# Patient Record
Sex: Male | Born: 1990 | Race: Asian | Hispanic: No | Marital: Single | Smoking: Never smoker
Health system: Southern US, Community
[De-identification: ages and names within clinical notes are randomized; demographics above are authoritative.]

## PROBLEM LIST (undated history)

## (undated) DIAGNOSIS — E785 Hyperlipidemia, unspecified: Secondary | ICD-10-CM

## (undated) DIAGNOSIS — R7302 Impaired glucose tolerance (oral): Secondary | ICD-10-CM

## (undated) DIAGNOSIS — E119 Type 2 diabetes mellitus without complications: Secondary | ICD-10-CM

## (undated) DIAGNOSIS — F32A Depression, unspecified: Secondary | ICD-10-CM

## (undated) DIAGNOSIS — F329 Major depressive disorder, single episode, unspecified: Secondary | ICD-10-CM

## (undated) DIAGNOSIS — D563 Thalassemia minor: Secondary | ICD-10-CM

## (undated) DIAGNOSIS — L988 Other specified disorders of the skin and subcutaneous tissue: Secondary | ICD-10-CM

## (undated) DIAGNOSIS — I10 Essential (primary) hypertension: Secondary | ICD-10-CM

## (undated) HISTORY — DX: Essential (primary) hypertension: I10

## (undated) HISTORY — DX: Type 2 diabetes mellitus without complications: E11.9

## (undated) HISTORY — DX: Impaired glucose tolerance (oral): R73.02

## (undated) HISTORY — DX: Depression, unspecified: F32.A

## (undated) HISTORY — DX: Major depressive disorder, single episode, unspecified: F32.9

## (undated) HISTORY — DX: Thalassemia minor: D56.3

## (undated) HISTORY — DX: Other specified disorders of the skin and subcutaneous tissue: L98.8

## (undated) HISTORY — DX: Hyperlipidemia, unspecified: E78.5

---
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
## 2010-11-24 ENCOUNTER — Ambulatory Visit: Payer: Self-pay | Admitting: Internal Medicine

## 2010-11-30 LAB — CONVERTED CEMR LAB
ALT: 70 units/L — ABNORMAL HIGH (ref 0–53)
AST: 33 units/L (ref 0–37)
Albumin: 4.2 g/dL (ref 3.5–5.2)
Alkaline Phosphatase: 67 units/L (ref 39–117)
BUN: 12 mg/dL (ref 6–23)
Basophils Absolute: 0.1 10*3/uL (ref 0.0–0.1)
Basophils Relative: 0.6 % (ref 0.0–3.0)
Bilirubin Urine: NEGATIVE
Bilirubin, Direct: 0.1 mg/dL (ref 0.0–0.3)
CO2: 28 meq/L (ref 19–32)
Calcium: 9.5 mg/dL (ref 8.4–10.5)
Chloride: 101 meq/L (ref 96–112)
Cholesterol: 249 mg/dL — ABNORMAL HIGH (ref 0–200)
Creatinine, Ser: 1 mg/dL (ref 0.4–1.5)
Direct LDL: 150.6 mg/dL
Eosinophils Absolute: 0.1 10*3/uL (ref 0.0–0.7)
Eosinophils Relative: 1.3 % (ref 0.0–5.0)
GFR calc non Af Amer: 108.06 mL/min (ref >60.00)
Glucose, Bld: 113 mg/dL — ABNORMAL HIGH (ref 70–99)
HCT: 48.7 % (ref 39.0–52.0)
HDL: 33.2 mg/dL — ABNORMAL LOW (ref >39.00)
Hemoglobin, Urine: NEGATIVE
Hemoglobin: 15.4 g/dL (ref 13.0–17.0)
Hgb A1c MFr Bld: 5.6 % (ref 4.6–6.5)
Ketones, ur: NEGATIVE mg/dL
Leukocytes, UA: NEGATIVE
Lymphocytes Relative: 33.2 % (ref 12.0–46.0)
Lymphs Abs: 2.9 10*3/uL (ref 0.7–4.0)
MCHC: 31.6 g/dL (ref 30.0–36.0)
MCV: 76.2 fL — ABNORMAL LOW (ref 78.0–100.0)
Monocytes Absolute: 0.5 10*3/uL (ref 0.1–1.0)
Monocytes Relative: 6.2 % (ref 3.0–12.0)
Neutro Abs: 5.1 10*3/uL (ref 1.4–7.7)
Neutrophils Relative %: 58.7 % (ref 43.0–77.0)
Nitrite: NEGATIVE
Platelets: 264 10*3/uL (ref 150.0–400.0)
Potassium: 4.3 meq/L (ref 3.5–5.1)
RBC: 6.4 M/uL — ABNORMAL HIGH (ref 4.22–5.81)
RDW: 13.6 % (ref 11.5–14.6)
Sodium: 140 meq/L (ref 135–145)
Specific Gravity, Urine: 1.015 (ref 1.000–1.030)
TSH: 1.96 microintl units/mL (ref 0.35–5.50)
Total Bilirubin: 0.7 mg/dL (ref 0.3–1.2)
Total CHOL/HDL Ratio: 8
Total Protein, Urine: NEGATIVE mg/dL
Total Protein: 7.5 g/dL (ref 6.0–8.3)
Triglycerides: 343 mg/dL — ABNORMAL HIGH (ref 0.0–149.0)
Urine Glucose: NEGATIVE mg/dL
Urobilinogen, UA: 0.2 (ref 0.0–1.0)
VLDL: 68.6 mg/dL — ABNORMAL HIGH (ref 0.0–40.0)
WBC: 8.7 10*3/uL (ref 4.5–10.5)
pH: 7 (ref 5.0–8.0)

## 2010-12-01 ENCOUNTER — Ambulatory Visit: Payer: Self-pay | Admitting: Internal Medicine

## 2010-12-01 DIAGNOSIS — E785 Hyperlipidemia, unspecified: Secondary | ICD-10-CM

## 2011-01-26 NOTE — Assessment & Plan Note (Signed)
Summary: physical---stc   Vital Signs:  Patient profile:   20 year old male Height:      68 inches Weight:      221.13 pounds BMI:     33.74 O2 Sat:      98 % on Room air Temp:     98.6 degrees F oral Pulse rate:   93 / minute BP sitting:   112 / 70  (left arm) Cuff size:   large  Vitals Entered By: Zella Ball Ewing CMA Duncan Dull) (December 01, 2010 1:06 PM)  O2 Flow:  Room air  CC: Adult Physical/RE   CC:  Adult Physical/RE.  History of Present Illness: here with mother for wellness;  overall doing ok;  still going to [employer] and getting fair grades;  Pt denies CP, worsening sob, doe, wheezing, orthopnea, pnd, worsening LE edema, palps, dizziness or syncope  Pt denies new neuro symptoms such as headache, facial or extremity weakness  Pt denies polydipsia, polyuria  Overall good compliance with meds, trying to follow low chol diet, wt stable, little excercise however .  No fever, wt loss, night sweats, loss of appetite or other constitutional symptoms  Denies worsening depressive symptoms, suicidal ideation, or panic.   Overall good compliance with meds, and good tolerability.  Pt states good ability with ADL's, low fall risk, home safety reviewed and adequate, no significant change in hearing or vision, trying to follow lower chol diet, and occasionally active only with regular excercise.   Preventive Screening-Counseling & Management      Drug Use:  no.    Problems Prior to Update: 1)  Depression  (ICD-311) 2)  Hyperlipidemia  (ICD-272.4) 3)  Depressive Disorder  (ICD-311) 4)  Preventive Health Care  (ICD-V70.0) 5)  Hypertension  (ICD-401.9)  Medications Prior to Update: 1)  Cozaar 100 Mg Tabs (Losartan Potassium) .Marland Kitchen.. 1 By Mouth Once Daily 2)  Hydrochlorothiazide 25 Mg Tabs (Hydrochlorothiazide) .... 1/2 By Mouth Once Daily  Current Medications (verified): 1)  Losartan Potassium-Hctz 100-12.5 Mg Tabs (Losartan Potassium-Hctz) .Marland Kitchen.. 1po Once Daily  Allergies (verified): 1)   Enalapril Maleate  Past History:  Past Surgical History: Last updated: 06/15/2008 removal of cyst under tongue/ 1997 exploration of superpubic area/2001  Family History: Last updated: 01/13/2008 father with dm, cholesterol grandparents with HTN, stroke uncle with hemorrahagic stroke at 47yo  Social History: Last updated: 12/01/2010 Never Smoked Alcohol use-no philipino lives with parents [employer] - studying for surgical tech Drug use-no  Risk Factors: Smoking Status: never (01/13/2008)  Past Medical History: Hypertension pelvic fistula - chronic Hyperlipidemia Depression  Social History: Reviewed history from 11/30/2009 and no changes required. Never Smoked Alcohol use-no philipino lives with parents [employer] - studying for surgical tech Drug use-no Drug Use:  no  Review of Systems  The patient denies anorexia, fever, vision loss, decreased hearing, hoarseness, chest pain, syncope, dyspnea on exertion, peripheral edema, prolonged cough, headaches, hemoptysis, abdominal pain, melena, hematochezia, severe indigestion/heartburn, hematuria, muscle weakness, suspicious skin lesions, transient blindness, difficulty walking, depression, unusual weight change, abnormal bleeding, enlarged lymph nodes, angioedema, and testicular masses.         all otherwise negative per pt -    Physical Exam  General:  alert and overweight-appearing.   Head:  normocephalic and atraumatic.   Eyes:  vision grossly intact, pupils equal, and pupils round.   Ears:  R ear normal and L ear normal.   Nose:  no external deformity and no nasal discharge.   Mouth:  no gingival  abnormalities and pharynx pink and moist.   Neck:  supple and no masses.   Lungs:  normal respiratory effort and normal breath sounds.   Heart:  normal rate and regular rhythm.   Abdomen:  soft, non-tender, and normal bowel sounds.   Msk:  no joint tenderness and no joint swelling.   Extremities:  no edema, no erythema    Neurologic:  cranial nerves II-XII intact and strength normal in all extremities.   Skin:  color normal and no rashes.   Psych:  not anxious appearing and flat affect.     Impression & Recommendations:  Problem # 1:  Preventive Health Care (ICD-V70.0) Overall doing well, age appropriate education and counseling updated, referral for preventive services and immunizations addressed, dietary counseling and smoking status adressed , most recent labs reviewed I have personally reviewed and have noted 1.The patient's medical and social history 2.Their use of alcohol, tobacco or illicit drugs 3.Their current medications and supplements 4. Functional ability including ADL's, fall risk, home safety risk, hearing & visual impairment  5.Diet and physical activities 6.Evidence for depression or mood disorders The patients weight, height, BMI  have been recorded in the chart I have made referrals, counseling and provided education to the patient based review of the above   Problem # 2:  HYPERTENSION (ICD-401.9)  The following medications were removed from the medication list:    Hydrochlorothiazide 25 Mg Tabs (Hydrochlorothiazide) .Marland Kitchen... 1/2 by mouth once daily His updated medication list for this problem includes:    Losartan Potassium-hctz 100-12.5 Mg Tabs (Losartan potassium-hctz) .Marland Kitchen... 1po once daily  BP today: 112/70 Prior BP: 110/76 (11/30/2009)  Labs Reviewed: K+: 4.3 (11/30/2010) Creat: : 1.0 (11/30/2010)   Chol: 249 (11/30/2010)   HDL: 33.20 (11/30/2010)   LDL: DEL (01/13/2008)   TG: 343.0 (11/30/2010) stable overall by hx and exam, ok to continue meds/tx as is  - meds adjusted to single rx combo med, same strengths  Problem # 3:  DEPRESSION (ICD-311) stable overall by hx and exam, ok to continue meds/tx as is - decleins need for med at this time or counseling  Problem # 4:  HYPERLIPIDEMIA (ICD-272.4)  Labs Reviewed: SGOT: 33 (11/30/2010)   SGPT: 70 (11/30/2010)   HDL:33.20  (11/30/2010), 32.30 (11/26/2009)  LDL:DEL (01/13/2008)  Chol:249 (11/30/2010), 245 (11/26/2009)  Trig:343.0 (11/30/2010), 318.0 (11/26/2009)  consider lipitor 10 mg  - pt or mother to call   Complete Medication List: 1)  Losartan Potassium-hctz 100-12.5 Mg Tabs (Losartan potassium-hctz) .Marland Kitchen.. 1po once daily  Other Orders: Admin 1st Vaccine ([address]) Flu Vaccine 36yrs + [phone])  Patient Instructions: 1)  you had the flu shot today 2)  call if you would like the lipitor 10 mg 3)  Continue all previous medications as before this visit 4)  Please schedule a follow-up appointment in 1 year, or sooner if needed Prescriptions: LOSARTAN POTASSIUM-HCTZ 100-12.5 MG TABS (LOSARTAN POTASSIUM-HCTZ) 1po once daily  #90 x 3   Entered and Authorized by:   Corwin Levins MD   Signed by:   Corwin Levins MD on 12/01/2010   Method used:   Print then Give to Patient   RxID:   [id number]    Orders Added: 1)  Admin 1st Vaccine [90471] 2)  Flu Vaccine 51yrs + [30865] 3)  Est. Patient 18-39 years [78469]  Flu Vaccine Consent Questions     Do you have a history of severe allergic reactions to this vaccine? no    Any prior history of allergic  reactions to egg and/or gelatin? no    Do you have a sensitivity to the preservative Thimersol? no    Do you have a past history of Guillan-Barre Syndrome? no    Do you currently have an acute febrile illness? no    Have you ever had a severe reaction to latex? no    Vaccine information given and explained to patient? yes    Are you currently pregnant? no    Lot Number:[id number]   Exp Date:06/24/2011   Site Given  Right Deltoid IM        .[email or web address]

## 2011-02-08 ENCOUNTER — Telehealth: Payer: Self-pay | Admitting: Internal Medicine

## 2011-02-15 NOTE — Progress Notes (Signed)
  Phone Note Refill Request Message from:  Fax from Pharmacy on February 08, 2011 4:25 PM  Refills Requested: Medication #1:  LOSARTAN POTASSIUM-HCTZ 100-12.5 MG TABS 1po once daily.   Dosage confirmed as above?Dosage Confirmed   Notes: [facility] Initial call taken by: Robin Ewing CMA Duncan Dull),  February 08, 2011 4:26 PM    Prescriptions: LOSARTAN POTASSIUM-HCTZ 100-12.5 MG TABS (LOSARTAN POTASSIUM-HCTZ) 1po once daily  #90 x 3   Entered by:   Scharlene Gloss CMA (AAMA)   Authorized by:   Corwin Levins MD   Signed by:   Scharlene Gloss CMA (AAMA) on 02/08/2011   Method used:   Faxed to ...       [facility]* (retail)       [address]       [address] Shipping/mailing       [place], [place]  [address]       Ph: [phone]       Fax: [phone]   RxID:   [id number]

## 2011-05-12 NOTE — Op Note (Signed)
[facility]  Patient:    Jerry Stokes, Jerry Stokes Visit Number: [id number] MRN: [id number]          Service Type: DSU Location: [facility] Attending Physician:  Carlos Levering Dictated by:   Hyman Bible Pendse, M.D. Proc. Date: 01/10/02 Admit Date:  01/10/2002 Discharge Date: 01/10/2002   CC:         Maeola Harman, M.D.                           Operative Report  PREOPERATIVE DIAGNOSIS: 1. Suprapubic sinus. 2. Redundant prepuce.  POSTOPERATIVE DIAGNOSIS: 1. Suprapubic sinus. 2. Redundant prepuce.  OPERATION PERFORMED: 1. Exploration of suprapubic region and excision of about an 8 cm long deep    suprapubic sinus. 2. Circumcision.  SURGEON:  Prabhakar D. Levie Heritage, M.D.  ASSISTANT:  Nelida Meuse, M.D.  ANESTHESIA:  Nurse.  INDICATIONS FOR PROCEDURE:  This 20 year old boy was noted a tiny opening of the suprapubic area at the base of the penis which drained occasional straw-colored fluid.  There had never been any history of infection. Preoperative sinogram was done which showed about a 1.5 cm deep suprapubic sinus without any communication with the bladder, urethra or any other structures.  OPERATIVE FINDINGS:  Exploration of the suprapubic area revealed well-developed sinus tract starting from the base of the penis and going vertically down deep to thick bulky pad of superpubic fat in the midline up to the pubic tubercle and then slightly to the left of midline to the pubic rami and no definite deeper communication could be identified.  The tract was partially blocked due to inspissated sebaceous material.  DESCRIPTION OF PROCEDURE:  Under satisfactory general anesthesia, patient in the supine position, the superpubic area and the penis were thoroughly prepped and draped in the usual manner.  An elliptical incision was made around the sinus opening.  A small probe was passed through the sinus which went down up to about 3 cm  .  Blunt and sharp dissection was carried out in a deeper plane and to my surprise, the sinus seemed to be well formed and going much deeper. Further dissection was carried out into a deeper plane.  The sinus was about 6 to 6 to 7 cm in length and what appeared to be attaching to the left pubic ramus.  There were no signs of inflammatory reaction.  The sinus was dissected out deep as was possible and two Hemoclips were applied and the sinus was cut. The area was irrigated.  Deeper layers approximated with 3-0 Vicryl interrupted sutures.  SKin closed with 5-0 nylon interrupted as well as running interlocking sutures.  Having completed this part of the procedure, circumcision procedure was initiated.  Circumferential incision was made over the distal aspect of the penis.  Skin was undermined distally.  Prepuce was everted after a dorsal slit incision.  ____________ incision about 3 to 4 mm ____________ coronal sulcus.  Redundant skin and mucosa excised.  Skin and mucosa were now approximated with 5-0 chromic sutures.  0.25% Marcaine with epinephrine was injected locally for postoperative analgesia.  Appropriate dressing applied.  Occlusive dressing over the superpubic area.  Xeroform gauze for the penis.  Throughout the procedure, the patients vital signs remained stable.  The patient withstood the procedure well and was transferred to the recovery room in satisfactory general condition. Dictated by:   Hyman Bible Pendse, M.D. Attending Physician:  Carlos Levering DD:  01/10/02 TD:  01/13/02 Job: [id number]

## 2011-09-25 LAB — POCT RAPID STREP A: Streptococcus, Group A Screen (Direct): NEGATIVE

## 2011-11-01 ENCOUNTER — Telehealth: Payer: Self-pay

## 2011-11-01 DIAGNOSIS — Z0000 Encounter for general adult medical examination without abnormal findings: Secondary | ICD-10-CM

## 2011-11-01 NOTE — Telephone Encounter (Signed)
Put order in for physical labs. 

## 2011-12-09 ENCOUNTER — Encounter: Payer: Self-pay | Admitting: Internal Medicine

## 2011-12-09 DIAGNOSIS — Z0001 Encounter for general adult medical examination with abnormal findings: Secondary | ICD-10-CM | POA: Insufficient documentation

## 2011-12-09 DIAGNOSIS — Z0000 Encounter for general adult medical examination without abnormal findings: Secondary | ICD-10-CM | POA: Insufficient documentation

## 2011-12-11 ENCOUNTER — Other Ambulatory Visit (INDEPENDENT_AMBULATORY_CARE_PROVIDER_SITE_OTHER): Payer: 59

## 2011-12-11 ENCOUNTER — Other Ambulatory Visit: Payer: Self-pay | Admitting: Internal Medicine

## 2011-12-11 DIAGNOSIS — Z0000 Encounter for general adult medical examination without abnormal findings: Secondary | ICD-10-CM

## 2011-12-11 LAB — BASIC METABOLIC PANEL WITH GFR
BUN: 13 mg/dL (ref 6–23)
CO2: 26 meq/L (ref 19–32)
Calcium: 9.4 mg/dL (ref 8.4–10.5)
Chloride: 105 meq/L (ref 96–112)
Creatinine, Ser: 0.9 mg/dL (ref 0.4–1.5)
GFR: 113.82 mL/min
Glucose, Bld: 113 mg/dL — ABNORMAL HIGH (ref 70–99)
Potassium: 4.2 meq/L (ref 3.5–5.1)
Sodium: 141 meq/L (ref 135–145)

## 2011-12-11 LAB — URINALYSIS, ROUTINE W REFLEX MICROSCOPIC
Bilirubin Urine: NEGATIVE
Hgb urine dipstick: NEGATIVE
Ketones, ur: NEGATIVE
Leukocytes, UA: NEGATIVE
Nitrite: NEGATIVE
Specific Gravity, Urine: >=1.03
Total Protein, Urine: NEGATIVE
Urine Glucose: NEGATIVE
Urobilinogen, UA: 0.2
pH: 6 (ref 5.0–8.0)

## 2011-12-11 LAB — HEPATIC FUNCTION PANEL
ALT: 67 U/L — ABNORMAL HIGH (ref 0–53)
AST: 29 U/L (ref 0–37)
Albumin: 4.5 g/dL (ref 3.5–5.2)
Alkaline Phosphatase: 72 U/L (ref 39–117)
Bilirubin, Direct: 0.1 mg/dL (ref 0.0–0.3)
Total Bilirubin: 0.9 mg/dL (ref 0.3–1.2)
Total Protein: 7.7 g/dL (ref 6.0–8.3)

## 2011-12-11 LAB — LIPID PANEL
Cholesterol: 231 mg/dL — ABNORMAL HIGH (ref 0–200)
HDL: 37.4 mg/dL — ABNORMAL LOW
Total CHOL/HDL Ratio: 6
Triglycerides: 219 mg/dL — ABNORMAL HIGH (ref 0.0–149.0)
VLDL: 43.8 mg/dL — ABNORMAL HIGH (ref 0.0–40.0)

## 2011-12-11 LAB — CBC WITH DIFFERENTIAL/PLATELET
Basophils Absolute: 0.1 K/uL (ref 0.0–0.1)
Basophils Relative: 0.8 % (ref 0.0–3.0)
Eosinophils Absolute: 0.1 K/uL (ref 0.0–0.7)
Eosinophils Relative: 1.3 % (ref 0.0–5.0)
HCT: 46.4 % (ref 39.0–52.0)
Hemoglobin: 14.9 g/dL (ref 13.0–17.0)
Lymphocytes Relative: 30.7 % (ref 12.0–46.0)
Lymphs Abs: 2.6 K/uL (ref 0.7–4.0)
MCHC: 32.1 g/dL (ref 30.0–36.0)
MCV: 75.4 fl — ABNORMAL LOW (ref 78.0–100.0)
Monocytes Absolute: 0.6 K/uL (ref 0.1–1.0)
Monocytes Relative: 7.3 % (ref 3.0–12.0)
Neutro Abs: 5.2 K/uL (ref 1.4–7.7)
Neutrophils Relative %: 59.9 % (ref 43.0–77.0)
Platelets: 250 K/uL (ref 150.0–400.0)
RBC: 6.15 Mil/uL — ABNORMAL HIGH (ref 4.22–5.81)
RDW: 13.5 % (ref 11.5–14.6)
WBC: 8.6 K/uL (ref 4.5–10.5)

## 2011-12-11 LAB — TSH: TSH: 1.47 u[IU]/mL (ref 0.35–5.50)

## 2011-12-12 ENCOUNTER — Ambulatory Visit (INDEPENDENT_AMBULATORY_CARE_PROVIDER_SITE_OTHER)
Admission: RE | Admit: 2011-12-12 | Discharge: 2011-12-12 | Disposition: A | Discharge status: Home / Self Care | Payer: 59 | Source: Ambulatory Visit | Attending: Internal Medicine | Admitting: Internal Medicine

## 2011-12-12 ENCOUNTER — Ambulatory Visit (INDEPENDENT_AMBULATORY_CARE_PROVIDER_SITE_OTHER): Payer: 59 | Admitting: Internal Medicine

## 2011-12-12 ENCOUNTER — Other Ambulatory Visit: Payer: 59

## 2011-12-12 ENCOUNTER — Encounter: Payer: Self-pay | Admitting: Internal Medicine

## 2011-12-12 VITALS — BP 120/70 | HR 79 | Temp 98.4°F | Ht 68.0 in | Wt 222.5 lb

## 2011-12-12 DIAGNOSIS — R7302 Impaired glucose tolerance (oral): Secondary | ICD-10-CM

## 2011-12-12 DIAGNOSIS — I10 Essential (primary) hypertension: Secondary | ICD-10-CM

## 2011-12-12 DIAGNOSIS — R7611 Nonspecific reaction to tuberculin skin test without active tuberculosis: Secondary | ICD-10-CM

## 2011-12-12 DIAGNOSIS — R7309 Other abnormal glucose: Secondary | ICD-10-CM

## 2011-12-12 DIAGNOSIS — E119 Type 2 diabetes mellitus without complications: Secondary | ICD-10-CM | POA: Insufficient documentation

## 2011-12-12 DIAGNOSIS — Z23 Encounter for immunization: Secondary | ICD-10-CM

## 2011-12-12 DIAGNOSIS — Z0000 Encounter for general adult medical examination without abnormal findings: Secondary | ICD-10-CM

## 2011-12-12 HISTORY — DX: Impaired glucose tolerance (oral): R73.02

## 2011-12-12 LAB — IBC PANEL: Saturation Ratios: 30.7 % (ref 20.0–50.0)

## 2011-12-12 NOTE — Patient Instructions (Signed)
You had the flu shot today Please go to LAB in the Basement for the blood and/or urine tests to be done today Please go to XRAY in the Basement for the x-ray test Please call the phone number [phone] (the [employer]Tree System) for results of testing in 2-3 days;  When calling, simply dial the number, and when prompted enter the MRN number above (the Medical Record Number) and the # key, then the message should start. Your form will be filled out soon Please return in 6 months for LAB only  - for sugar and cholesterol Please return in 1 year for your yearly visit, or sooner if needed, with Lab testing done 3-5 days before

## 2011-12-17 ENCOUNTER — Encounter: Payer: Self-pay | Admitting: Internal Medicine

## 2011-12-17 NOTE — Assessment & Plan Note (Signed)
stable overall by hx and exam, most recent data reviewed with pt, and pt to continue medical treatment as before  BP Readings from Last 3 Encounters:  12/12/11 120/70  12/01/10 112/70  11/30/09 110/76

## 2011-12-17 NOTE — Assessment & Plan Note (Signed)
Overall doing well, age appropriate education and counseling updated, referrals for preventative services and immunizations addressed, dietary and smoking counseling addressed, most recent labs and ECG reviewed.  I have personally reviewed and have noted: 1) the patient's medical and social history 2) The pt's use of alcohol, tobacco, and illicit drugs 3) The patient's current medications and supplements 4) Functional ability including ADL's, fall risk, home safety risk, hearing and visual impairment 5) Diet and physical activities 6) Evidence for depression or mood disorder 7) The patient's height, weight, and BMI have been recorded in the chart I have made referrals, and provided counseling and education based on review of the above Form to be filled out for [facility]

## 2011-12-17 NOTE — Assessment & Plan Note (Signed)
For CXR today

## 2011-12-17 NOTE — Progress Notes (Signed)
Subjective:    Patient ID: Jerry Stokes, male    DOB: Jun 28, 1991, 20 y.o.   MRN: [id number]  HPI  Here for wellness and f/u;  Overall doing ok;  Pt denies CP, worsening SOB, DOE, wheezing, orthopnea, PND, worsening LE edema, palpitations, dizziness or syncope.  Pt denies neurological change such as new Headache, facial or extremity weakness.  Pt denies polydipsia, polyuria, or low sugar symptoms. Pt states overall good compliance with treatment and medications, good tolerability, and trying to follow lower cholesterol diet.  Pt denies worsening depressive symptoms, suicidal ideation or panic. No fever, wt loss, night sweats, loss of appetite, or other constitutional symptoms.  Pt states good ability with ADL's, low fall risk, home safety reviewed and adequate, no significant changes in hearing or vision, and occasionally active with exercise.  Needs form filled out for [facility] as he is transferring there from [facility].  For flu shot today.  Needs varicella titer, as well as CXR due to hx of + PPD (s/p INH at 20yo) and also received BCG vaccination.   Past Medical History  Diagnosis Date  . Hypertension   . Depression   . Hyperlipidemia   . Fistula     Pelvic  . Impaired glucose tolerance 12/12/2011   Past Surgical History  Procedure Date  . Cystectomy 1997    Under tongue  . Exploratory laparotomy 2001    Suprapubic fistula repair age 83    reports that he has never smoked. He does not have any smokeless tobacco history on file. He reports that he does not drink alcohol or use illicit drugs. family history includes Diabetes in his father; Hyperlipidemia in his father; Hypertension in his other; and Stroke in his other. Also - + FH thallasemia trait. Allergies  Allergen Reactions  . Enalapril Maleate     REACTION: cough   Current Outpatient Prescriptions on File Prior to Visit  Medication Sig Dispense Refill  . losartan-hydrochlorothiazide (HYZAAR) 100-12.5 MG per tablet Take 1 tablet by mouth  daily.         Review of Systems Review of Systems  Constitutional: Negative for diaphoresis, activity change, appetite change and unexpected weight change.  HENT: Negative for hearing loss, ear pain, facial swelling, mouth sores and neck stiffness.   Eyes: Negative for pain, redness and visual disturbance.  Respiratory: Negative for shortness of breath and wheezing.   Cardiovascular: Negative for chest pain and palpitations.  Gastrointestinal: Negative for diarrhea, blood in stool, abdominal distention and rectal pain.  Genitourinary: Negative for hematuria, flank pain and decreased urine volume.  Musculoskeletal: Negative for myalgias and joint swelling.  Skin: Negative for color change and wound.  Neurological: Negative for syncope and numbness.  Hematological: Negative for adenopathy.  Psychiatric/Behavioral: Negative for hallucinations, self-injury, decreased concentration and agitation.      Objective:   Physical Exam BP 120/70  Pulse 79  Temp(Src) 98.4 F (36.9 C) (Oral)  Ht 5\' 8"  (1.727 m)  Wt 222 lb 8 oz (100.925 kg)  BMI 33.83 kg/m2  SpO2 97% Physical Exam  VS noted Constitutional: Pt is oriented to person, place, and time. Appears well-developed and well-nourished.  HENT:  Head: Normocephalic and atraumatic.  Right Ear: External ear normal.  Left Ear: External ear normal.  Nose: Nose normal.  Mouth/Throat: Oropharynx is clear and moist.  Eyes: Conjunctivae and EOM are normal. Pupils are equal, round, and reactive to light.  Neck: Normal range of motion. Neck supple. No JVD present. No tracheal deviation present.  Cardiovascular: Normal rate, regular rhythm, normal heart sounds and intact distal pulses.   Pulmonary/Chest: Effort normal and breath sounds normal.  Abdominal: Soft. Bowel sounds are normal. There is no tenderness.  Musculoskeletal: Normal range of motion. Exhibits no edema.  Lymphadenopathy:  Has no cervical adenopathy.  Neurological: Pt is alert and  oriented to person, place, and time. Pt has normal reflexes. No cranial nerve deficit.  Skin: Skin is warm and dry. No rash noted.  Psychiatric:  Has chronic "masked" facies with severe limited emotional expression. Behavior is o/w normal. Does perk up with talking about video games    Assessment & Plan:

## 2011-12-17 NOTE — Assessment & Plan Note (Signed)
Stressed increased exercise and wt control, for a1c

## 2012-01-01 ENCOUNTER — Other Ambulatory Visit: Payer: Self-pay | Admitting: Internal Medicine

## 2012-01-01 ENCOUNTER — Telehealth: Payer: Self-pay

## 2012-01-01 ENCOUNTER — Other Ambulatory Visit: Payer: 59

## 2012-01-01 DIAGNOSIS — Z0000 Encounter for general adult medical examination without abnormal findings: Secondary | ICD-10-CM

## 2012-01-01 NOTE — Telephone Encounter (Signed)
Patient came by the office and did pickup the patients form for [facility]. Also per MD gave her a copy of the patients recent lab results. Informed the patient would need to return to the lab to have varicella titer done and did apologize as to not being done at last lab visit. Patient did request the name of our manager as she was concerned that the titer lab was not done. Informed her of managers name.

## 2012-01-01 NOTE — Telephone Encounter (Signed)
To contact [facility] directly (robin or mother) to confirm, if not, I have re-done the order, made sure it was ""future" this time, though I think it was before as well

## 2012-01-01 NOTE — Progress Notes (Signed)
Order re-done; made sure status was for "future"

## 2012-01-01 NOTE — Telephone Encounter (Signed)
Called the patients mother left message to call back

## 2012-01-01 NOTE — Telephone Encounter (Signed)
Called the patients mom informed to pickup form. Also called the lab and they did not draw the titer. Informed the patients mother who thinks it was done, please advise

## 2012-01-01 NOTE — Telephone Encounter (Signed)
Patient's mother called stating they need the for [employer] to be completed today if possible. The patient was seen in December and the form was left to complete. Call back number is [phone] when ready

## 2012-01-01 NOTE — Telephone Encounter (Signed)
The form is complete from my point of view except for the varicalla titer result that I was waiting for; and now I think I see on the EMR that for some reason it was not done?  Robin to call lab; was it sent to [facility] and do we need to call them for result?

## 2012-03-21 ENCOUNTER — Other Ambulatory Visit: Payer: Self-pay

## 2012-03-21 MED ORDER — LOSARTAN POTASSIUM-HCTZ 100-12.5 MG PO TABS: 1.0000 | ORAL_TABLET | Freq: Every day | ORAL | Status: DC

## 2012-11-07 ENCOUNTER — Other Ambulatory Visit: Payer: Self-pay | Admitting: Internal Medicine

## 2012-12-17 ENCOUNTER — Other Ambulatory Visit (INDEPENDENT_AMBULATORY_CARE_PROVIDER_SITE_OTHER): Payer: 59

## 2012-12-17 ENCOUNTER — Encounter: Payer: Self-pay | Admitting: Internal Medicine

## 2012-12-17 ENCOUNTER — Ambulatory Visit (INDEPENDENT_AMBULATORY_CARE_PROVIDER_SITE_OTHER): Payer: 59 | Admitting: Internal Medicine

## 2012-12-17 ENCOUNTER — Encounter: Payer: 59 | Admitting: Internal Medicine

## 2012-12-17 VITALS — BP 132/80 | HR 76 | Temp 97.1°F | Ht 69.0 in | Wt 224.0 lb

## 2012-12-17 DIAGNOSIS — Z0000 Encounter for general adult medical examination without abnormal findings: Secondary | ICD-10-CM

## 2012-12-17 DIAGNOSIS — Z23 Encounter for immunization: Secondary | ICD-10-CM

## 2012-12-17 DIAGNOSIS — R7302 Impaired glucose tolerance (oral): Secondary | ICD-10-CM

## 2012-12-17 DIAGNOSIS — F329 Major depressive disorder, single episode, unspecified: Secondary | ICD-10-CM

## 2012-12-17 DIAGNOSIS — R7309 Other abnormal glucose: Secondary | ICD-10-CM

## 2012-12-17 DIAGNOSIS — I10 Essential (primary) hypertension: Secondary | ICD-10-CM

## 2012-12-17 LAB — BASIC METABOLIC PANEL
Calcium: 9.6 mg/dL (ref 8.4–10.5)
GFR: 104.61 mL/min (ref >60.00)
Glucose, Bld: 107 mg/dL — ABNORMAL HIGH (ref 70–99)
Potassium: 3.8 mEq/L (ref 3.5–5.1)
Sodium: 139 mEq/L (ref 135–145)

## 2012-12-17 LAB — URINALYSIS, ROUTINE W REFLEX MICROSCOPIC
Bilirubin Urine: NEGATIVE
Ketones, ur: NEGATIVE
Total Protein, Urine: NEGATIVE
Urine Glucose: NEGATIVE
Urobilinogen, UA: 0.2 (ref 0.0–1.0)

## 2012-12-17 LAB — HEPATIC FUNCTION PANEL
ALT: 85 U/L — ABNORMAL HIGH (ref 0–53)
AST: 36 U/L (ref 0–37)
Total Protein: 7.8 g/dL (ref 6.0–8.3)

## 2012-12-17 LAB — CBC WITH DIFFERENTIAL/PLATELET
Basophils Relative: 0.4 % (ref 0.0–3.0)
Eosinophils Relative: 1.6 % (ref 0.0–5.0)
HCT: 48.3 % (ref 39.0–52.0)
Hemoglobin: 15.4 g/dL (ref 13.0–17.0)
Lymphs Abs: 3.7 10*3/uL (ref 0.7–4.0)
MCV: 74.2 fl — ABNORMAL LOW (ref 78.0–100.0)
Monocytes Absolute: 0.8 10*3/uL (ref 0.1–1.0)
Monocytes Relative: 8 % (ref 3.0–12.0)
Neutro Abs: 5.6 10*3/uL (ref 1.4–7.7)
RBC: 6.5 Mil/uL — ABNORMAL HIGH (ref 4.22–5.81)
WBC: 10.3 10*3/uL (ref 4.5–10.5)

## 2012-12-17 LAB — LIPID PANEL
Cholesterol: 255 mg/dL — ABNORMAL HIGH (ref 0–200)
HDL: 32.2 mg/dL — ABNORMAL LOW (ref >39.00)
VLDL: 42.4 mg/dL — ABNORMAL HIGH (ref 0.0–40.0)

## 2012-12-17 LAB — TSH: TSH: 3.79 u[IU]/mL (ref 0.35–5.50)

## 2012-12-17 MED ORDER — LOSARTAN POTASSIUM-HCTZ 100-12.5 MG PO TABS: 1.0000 | ORAL_TABLET | Freq: Every day | ORAL | Status: DC

## 2012-12-17 NOTE — Patient Instructions (Addendum)
Please consider the Gardasil vaccination You had the flu shot today Continue all other medications as before; please re-start the blood pressure medication Please continue your efforts at being more active, low cholesterol diet, and weight control. Your blood work was done today You will be contacted by phone if any changes need to be made immediately.  Otherwise, you will receive a letter about your results with an explanation, but please check with MyChart first. Thank you for enrolling in MyChart. Please follow the instructions below to securely access your online medical record. MyChart allows you to send messages to your doctor, view your test results, renew your prescriptions, schedule appointments, and more. To Log into MyChart, please go to [email or web address], and your Username is: [email or web address] Please return in 1 year for your yearly visit, or sooner if needed, with Lab testing done 3-5 days before

## 2012-12-19 ENCOUNTER — Encounter: Payer: Self-pay | Admitting: Internal Medicine

## 2012-12-19 NOTE — Progress Notes (Signed)
Subjective:    Patient ID: Jerry Stokes, male    DOB: December 28, 1990, 21 y.o.   MRN: [id number]  HPI  Here for wellness and f/u;  Overall doing ok;  Pt denies CP, worsening SOB, DOE, wheezing, orthopnea, PND, worsening LE edema, palpitations, dizziness or syncope.  Pt denies neurological change such as new Headache, facial or extremity weakness.  Pt denies polydipsia, polyuria, or low sugar symptoms. Pt states overall poor compliance with treatment and medications, o/w good tolerability, and not reallty trying to follow lower cholesterol diet.  Pt denies worsening depressive symptoms, suicidal ideation or panic. No fever, wt loss, night sweats, loss of appetite, or other constitutional symptoms.  Pt states good ability with ADL's, low fall risk, home safety reviewed and adequate, no significant changes in hearing or vision, and rarely active with exercise. Is taking classes at [employer], has mult semesters left to finish.  Mother conts to support well, and states she (and pt) hoping pt to apply eventually to medical school. Past Medical History  Diagnosis Date  . Hypertension   . Depression   . Hyperlipidemia   . Fistula     Pelvic  . Impaired glucose tolerance 12/12/2011  . Alpha thalassemia trait     several in family, as well as mother   Past Surgical History  Procedure Date  . Cystectomy 1997    Under tongue  . Exploratory laparotomy 2001    Suprapubic fistula repair age 4    reports that he has never smoked. He does not have any smokeless tobacco history on file. He reports that he does not drink alcohol or use illicit drugs. family history includes Diabetes in his father; Hyperlipidemia in his father; Hypertension in his other; and Stroke in his other. Allergies  Allergen Reactions  . Enalapril Maleate     REACTION: cough   Current Outpatient Prescriptions on File Prior to Visit  Medication Sig Dispense Refill  . losartan-hydrochlorothiazide (HYZAAR) 100-12.5 MG per tablet Take 1  tablet by mouth daily.  90 tablet  3    Review of Systems Review of Systems  Constitutional: Negative for diaphoresis, activity change, appetite change and unexpected weight change.  HENT: Negative for hearing loss, ear pain, facial swelling, mouth sores and neck stiffness.   Eyes: Negative for pain, redness and visual disturbance.  Respiratory: Negative for shortness of breath and wheezing.   Cardiovascular: Negative for chest pain and palpitations.  Gastrointestinal: Negative for diarrhea, blood in stool, abdominal distention and rectal pain.  Genitourinary: Negative for hematuria, flank pain and decreased urine volume.  Musculoskeletal: Negative for myalgias and joint swelling.  Skin: Negative for color change and wound.  Neurological: Negative for syncope and numbness.  Hematological: Negative for adenopathy.  Psychiatric/Behavioral: Negative for hallucinations, self-injury, decreased concentration and agitation.      Objective:   Physical Exam BP 132/80  Pulse 76  Temp 97.1 F (36.2 C) (Oral)  Ht 5\' 9"  (1.753 m)  Wt 224 lb (101.606 kg)  BMI 33.08 kg/m2  SpO2 98% Physical Exam  VS noted Constitutional: Pt is oriented to person, place, and time. Appears well-developed and well-nourished.  Head: Normocephalic and atraumatic.  Right Ear: External ear normal.  Left Ear: External ear normal.  Nose: Nose normal.  Mouth/Throat: Oropharynx is clear and moist.  Eyes: Conjunctivae and EOM are normal. Pupils are equal, round, and reactive to light.  Neck: Normal range of motion. Neck supple. No JVD present. No tracheal deviation present.  Cardiovascular: Normal rate, regular  rhythm, normal heart sounds and intact distal pulses.   Pulmonary/Chest: Effort normal and breath sounds normal.  Abdominal: Soft. Bowel sounds are normal. There is no tenderness.  Musculoskeletal: Normal range of motion. Exhibits no edema.  Lymphadenopathy:  Has no cervical adenopathy.  Neurological: Pt is  alert and oriented to person, place, and time. Pt has normal reflexes. No cranial nerve deficit.  Skin: Skin is warm and dry. No rash noted.  Psychiatric:  Has masked facies type of dysphoric mood and persistent psychomotor retardation, chronic for him.  Behavior is o/w normal.     Assessment & Plan:

## 2012-12-19 NOTE — Assessment & Plan Note (Signed)

## 2012-12-19 NOTE — Assessment & Plan Note (Addendum)
stable overall by hx and exam, most recent data reviewed with pt, and pt to continue medical treatment as before, encouraged compliance BP Readings from Last 3 Encounters:  12/17/12 132/80  12/12/11 120/70  12/01/10 112/70

## 2012-12-19 NOTE — Assessment & Plan Note (Signed)
Has cont'd evidence for ongoing chronic persistent mood d/o, declines tx or referral at this time

## 2013-09-23 ENCOUNTER — Telehealth: Payer: Self-pay

## 2013-09-23 DIAGNOSIS — Z0000 Encounter for general adult medical examination without abnormal findings: Secondary | ICD-10-CM

## 2013-09-23 NOTE — Telephone Encounter (Signed)
cpx labs entered  

## 2013-10-09 ENCOUNTER — Ambulatory Visit (INDEPENDENT_AMBULATORY_CARE_PROVIDER_SITE_OTHER): Payer: 59

## 2013-10-09 DIAGNOSIS — Z23 Encounter for immunization: Secondary | ICD-10-CM

## 2013-12-24 ENCOUNTER — Ambulatory Visit (INDEPENDENT_AMBULATORY_CARE_PROVIDER_SITE_OTHER): Payer: 59 | Admitting: Internal Medicine

## 2013-12-24 ENCOUNTER — Other Ambulatory Visit (INDEPENDENT_AMBULATORY_CARE_PROVIDER_SITE_OTHER): Payer: 59

## 2013-12-24 ENCOUNTER — Encounter: Payer: Self-pay | Admitting: Internal Medicine

## 2013-12-24 VITALS — BP 124/92 | HR 79 | Temp 97.8°F | Wt 229.0 lb

## 2013-12-24 DIAGNOSIS — R945 Abnormal results of liver function studies: Secondary | ICD-10-CM

## 2013-12-24 DIAGNOSIS — R7989 Other specified abnormal findings of blood chemistry: Secondary | ICD-10-CM

## 2013-12-24 DIAGNOSIS — R7302 Impaired glucose tolerance (oral): Secondary | ICD-10-CM

## 2013-12-24 DIAGNOSIS — Z0000 Encounter for general adult medical examination without abnormal findings: Secondary | ICD-10-CM

## 2013-12-24 DIAGNOSIS — R7309 Other abnormal glucose: Secondary | ICD-10-CM

## 2013-12-24 LAB — URINALYSIS, ROUTINE W REFLEX MICROSCOPIC
Bilirubin Urine: NEGATIVE
Leukocytes, UA: NEGATIVE
Nitrite: NEGATIVE
Specific Gravity, Urine: >=1.03 — AB (ref 1.000–1.030)
Urobilinogen, UA: 0.2 (ref 0.0–1.0)
pH: 5.5 (ref 5.0–8.0)

## 2013-12-24 LAB — BASIC METABOLIC PANEL
BUN: 11 mg/dL (ref 6–23)
CO2: 28 mEq/L (ref 19–32)
Calcium: 9.4 mg/dL (ref 8.4–10.5)
Creatinine, Ser: 1 mg/dL (ref 0.4–1.5)
Glucose, Bld: 127 mg/dL — ABNORMAL HIGH (ref 70–99)
Potassium: 3.4 mEq/L — ABNORMAL LOW (ref 3.5–5.1)

## 2013-12-24 LAB — CBC WITH DIFFERENTIAL/PLATELET
Basophils Absolute: 0 10*3/uL (ref 0.0–0.1)
Basophils Relative: 0.5 % (ref 0.0–3.0)
Hemoglobin: 15.6 g/dL (ref 13.0–17.0)
Lymphocytes Relative: 30.9 % (ref 12.0–46.0)
MCV: 73.2 fl — ABNORMAL LOW (ref 78.0–100.0)
Monocytes Relative: 7.1 % (ref 3.0–12.0)
Neutro Abs: 5.7 10*3/uL (ref 1.4–7.7)
Neutrophils Relative %: 60.1 % (ref 43.0–77.0)
Platelets: 301 10*3/uL (ref 150.0–400.0)
RBC: 6.71 Mil/uL — ABNORMAL HIGH (ref 4.22–5.81)
RDW: 13.6 % (ref 11.5–14.6)
WBC: 9.5 10*3/uL (ref 4.5–10.5)

## 2013-12-24 LAB — LIPID PANEL
Cholesterol: 291 mg/dL — ABNORMAL HIGH (ref 0–200)
HDL: 31.8 mg/dL — ABNORMAL LOW (ref >39.00)
Triglycerides: 508 mg/dL — ABNORMAL HIGH (ref 0.0–149.0)

## 2013-12-24 LAB — HEPATIC FUNCTION PANEL
ALT: 101 U/L — ABNORMAL HIGH (ref 0–53)
AST: 46 U/L — ABNORMAL HIGH (ref 0–37)
Albumin: 4.5 g/dL (ref 3.5–5.2)
Bilirubin, Direct: 0 mg/dL (ref 0.0–0.3)
Total Bilirubin: 1 mg/dL (ref 0.3–1.2)
Total Protein: 8 g/dL (ref 6.0–8.3)

## 2013-12-24 LAB — HEPATITIS PANEL, ACUTE
HCV Ab: NEGATIVE
Hep B C IgM: NONREACTIVE
Hepatitis B Surface Ag: NEGATIVE

## 2013-12-24 LAB — TSH: TSH: 2.06 u[IU]/mL (ref 0.35–5.50)

## 2013-12-24 LAB — LDL CHOLESTEROL, DIRECT: Direct LDL: 168.4 mg/dL

## 2013-12-24 LAB — HEMOGLOBIN A1C: Hgb A1c MFr Bld: 6.3 % (ref 4.6–6.5)

## 2013-12-24 MED ORDER — LOSARTAN POTASSIUM-HCTZ 100-12.5 MG PO TABS: 1.0000 | ORAL_TABLET | Freq: Every day | ORAL | Status: DC

## 2013-12-24 NOTE — Assessment & Plan Note (Signed)
stable overall by history and exam, recent data reviewed with pt, and pt to continue medical treatment as before,  to f/u any worsening symptoms or concerns Lab Results  Component Value Date   HGBA1C 6.3 12/24/2013

## 2013-12-24 NOTE — Patient Instructions (Signed)
Please continue all other medications as before, and refills have been done if requested. Please have the pharmacy call with any other refills you may need. Please continue your efforts at being more active, low cholesterol diet, and weight control. You are otherwise up to date with prevention measures today. Please go to the LAB in the Basement (turn left off the elevator) for the tests to be done today You will be contacted by phone if any changes need to be made immediately.  Otherwise, you will receive a letter about your results with an explanation, but please check with MyChart first. Please keep your appointments with your specialists as you may have planned  Please remember to sign up for My Chart if you have not done so, as this will be important to you in the future with finding out test results, communicating by private email, and scheduling acute appointments online when needed.

## 2013-12-24 NOTE — Assessment & Plan Note (Signed)
Asymtp, for u/s, f/u LFT's, hepatitis panel - ? Fatty liver

## 2013-12-24 NOTE — Progress Notes (Signed)
Pre visit review using our clinic review tool, if applicable. No additional management support is needed unless otherwise documented below in the visit note. 

## 2013-12-24 NOTE — Progress Notes (Signed)
Subjective:    Patient ID: Jerry Stokes, male    DOB: [birth year], 22 y.o.   MRN: [id number]  HPI  Here for wellness with mother;  Overall doing ok;  Pt denies CP, worsening SOB, DOE, wheezing, orthopnea, PND, worsening LE edema, palpitations, dizziness or syncope.  Pt denies neurological change such as new headache, facial or extremity weakness.  Pt denies polydipsia, polyuria, or low sugar symptoms. Pt states overall good compliance with treatment and medications, good tolerability, and has been trying to follow lower cholesterol diet.  Pt denies worsening depressive symptoms, suicidal ideation or panic. No fever, night sweats, wt loss, loss of appetite, or other constitutional symptoms.  Pt states good ability with ADL's, has low fall risk, home safety reviewed and adequate, no other significant changes in hearing or vision, and only occasionally active with exercise. Wt up several lbs. No acute complaints. Finishing [employer] after transferred from [facility] Past Medical History  Diagnosis Date  . Hypertension   . Depression   . Hyperlipidemia   . Fistula     Pelvic  . Impaired glucose tolerance 12/12/2011  . Alpha thalassemia trait     several in family, as well as mother   Past Surgical History  Procedure Laterality Date  . Cystectomy  1997    Under tongue  . Exploratory laparotomy  2001    Suprapubic fistula repair age 14    reports that he has never smoked. He does not have any smokeless tobacco history on file. He reports that he does not drink alcohol or use illicit drugs. family history includes Diabetes in his father; Hyperlipidemia in his father; Hypertension in his other; Stroke in his other. Allergies  Allergen Reactions  . Enalapril Maleate     REACTION: cough   No current outpatient prescriptions on file prior to visit.   No current facility-administered medications on file prior to visit.   Review of Systems Constitutional: Negative for diaphoresis, activity change,  appetite change or unexpected weight change.  HENT: Negative for hearing loss, ear pain, facial swelling, mouth sores and neck stiffness.   Eyes: Negative for pain, redness and visual disturbance.  Respiratory: Negative for shortness of breath and wheezing.   Cardiovascular: Negative for chest pain and palpitations.  Gastrointestinal: Negative for diarrhea, blood in stool, abdominal distention or other pain Genitourinary: Negative for hematuria, flank pain or change in urine volume.  Musculoskeletal: Negative for myalgias and joint swelling.  Skin: Negative for color change and wound.  Neurological: Negative for syncope and numbness. other than noted Hematological: Negative for adenopathy.  Psychiatric/Behavioral: Negative for hallucinations, self-injury, decreased concentration and agitation.      Objective:   Physical Exam BP 124/92  Pulse 79  Temp(Src) 97.8 F (36.6 C) (Oral)  Wt 229 lb (103.874 kg)  SpO2 98% VS noted,  Constitutional: Pt is oriented to person, place, and time. Appears well-developed and well-nourished. [name] Head: Normocephalic and atraumatic.  Right Ear: External ear normal.  Left Ear: External ear normal.  Nose: Nose normal.  Mouth/Throat: Oropharynx is clear and moist.  Eyes: Conjunctivae and EOM are normal. Pupils are equal, round, and reactive to light.  Neck: Normal range of motion. Neck supple. No JVD present. No tracheal deviation present.  Cardiovascular: Normal rate, regular rhythm, normal heart sounds and intact distal pulses.   Pulmonary/Chest: Effort normal and breath sounds normal.  Abdominal: Soft. Bowel sounds are normal. There is no tenderness. No HSM  Musculoskeletal: Normal range of motion. Exhibits no edema.  Lymphadenopathy:  Has no cervical adenopathy.  Neurological: Pt is alert and oriented to person, place, and time. Pt has normal reflexes. No cranial nerve deficit.  Skin: Skin is warm and dry. No rash noted.  Psychiatric:  Has flat  mood and affect. Behavior is normal.     Assessment & Plan:

## 2013-12-24 NOTE — Assessment & Plan Note (Signed)

## 2013-12-31 ENCOUNTER — Ambulatory Visit (HOSPITAL_COMMUNITY)
Admission: RE | Admit: 2013-12-31 | Discharge: 2013-12-31 | Disposition: A | Discharge status: Home / Self Care | Payer: 59 | Source: Ambulatory Visit | Attending: Internal Medicine | Admitting: Internal Medicine

## 2013-12-31 DIAGNOSIS — K7689 Other specified diseases of liver: Secondary | ICD-10-CM | POA: Insufficient documentation

## 2013-12-31 DIAGNOSIS — R945 Abnormal results of liver function studies: Secondary | ICD-10-CM

## 2013-12-31 DIAGNOSIS — R7989 Other specified abnormal findings of blood chemistry: Secondary | ICD-10-CM | POA: Insufficient documentation

## 2014-07-15 IMAGING — US US ABDOMEN COMPLETE
1 series · 14 of 25 positions shown · non-contrast
Comparison: None.

CLINICAL DATA: Persistent elevated LFTs

EXAM:
ULTRASOUND ABDOMEN COMPLETE

[Series 1: us abdomen complete · 0.29mm/px · 14 of 74 slices shown]
[im 1/74]
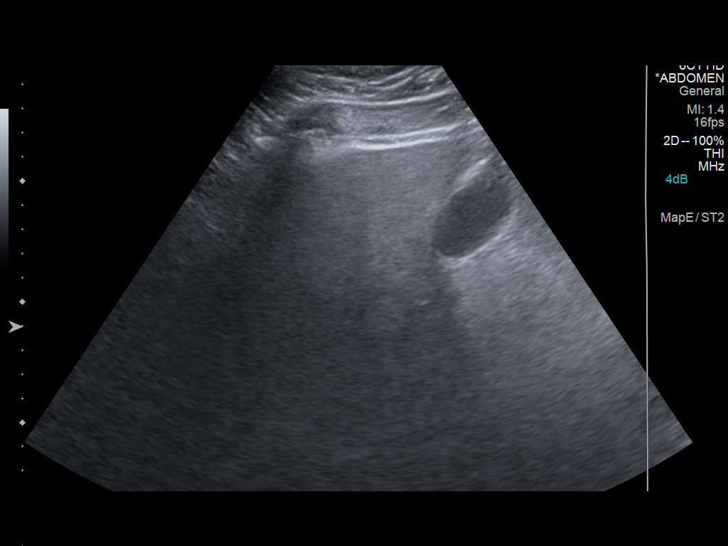
[im 7/74]
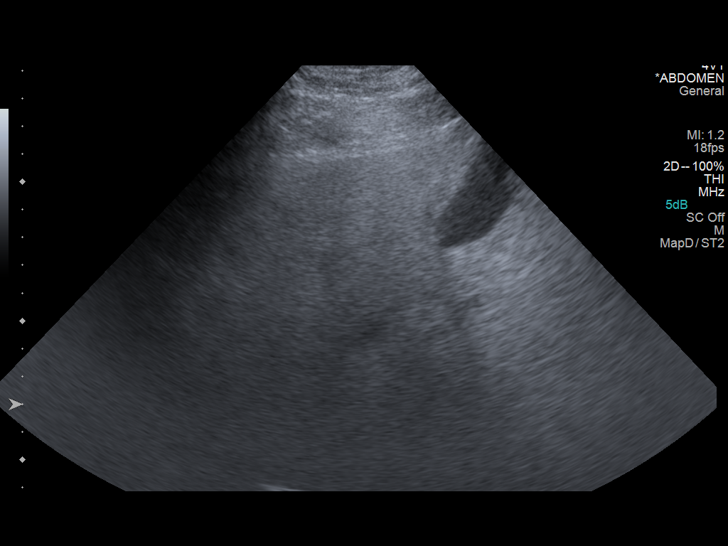
[im 13/74]
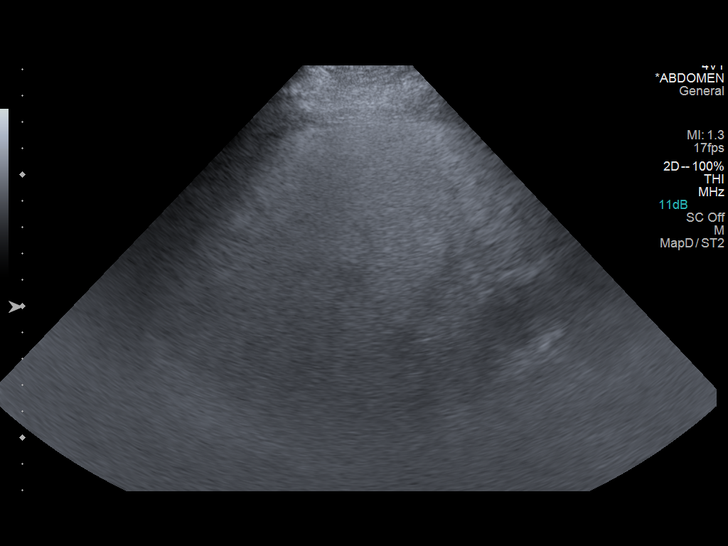
[im 19/74]
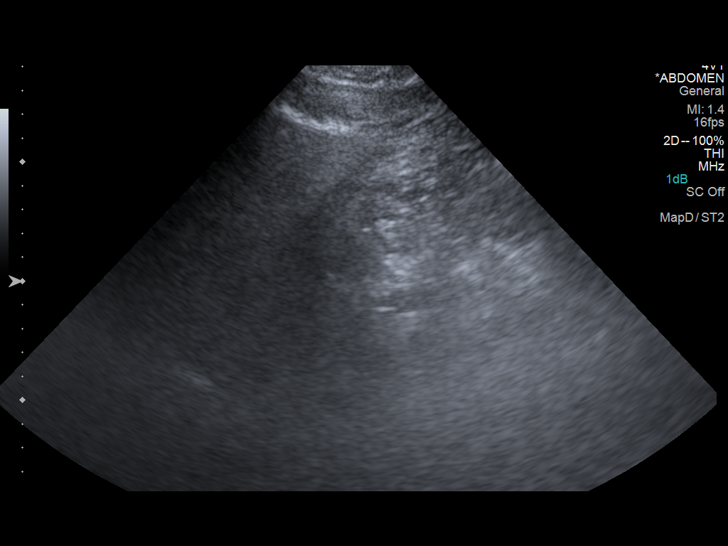
[im 25/74]
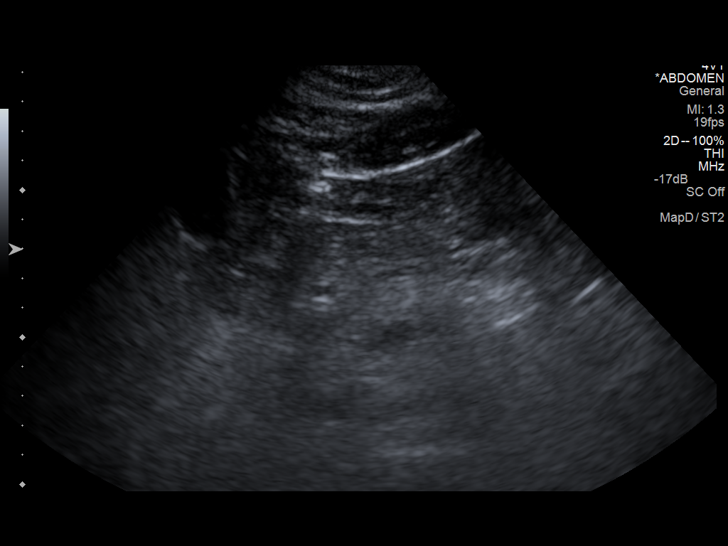
[im 28/74]
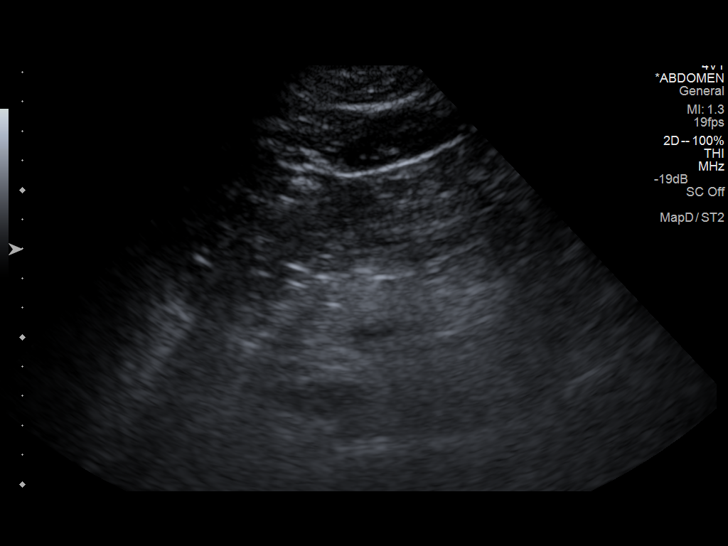
[im 34/74]
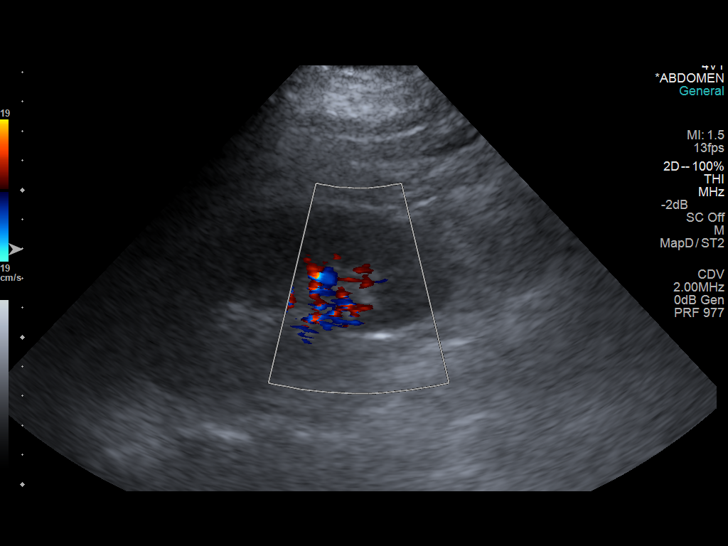
[im 40/74]
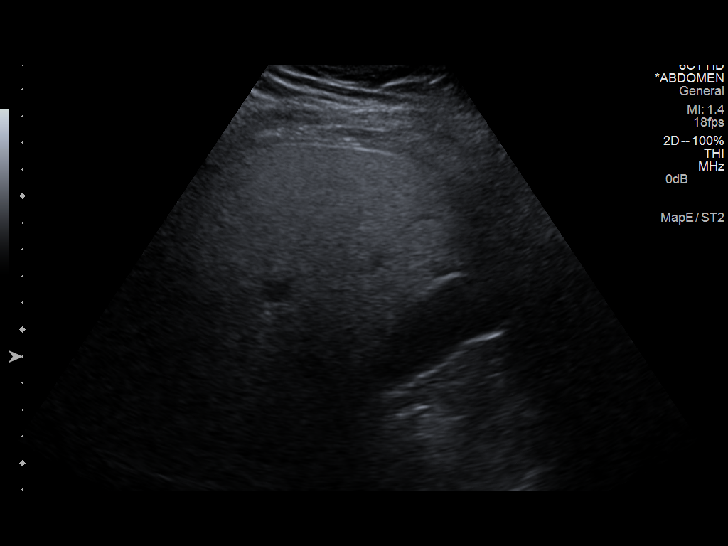
[im 46/74]
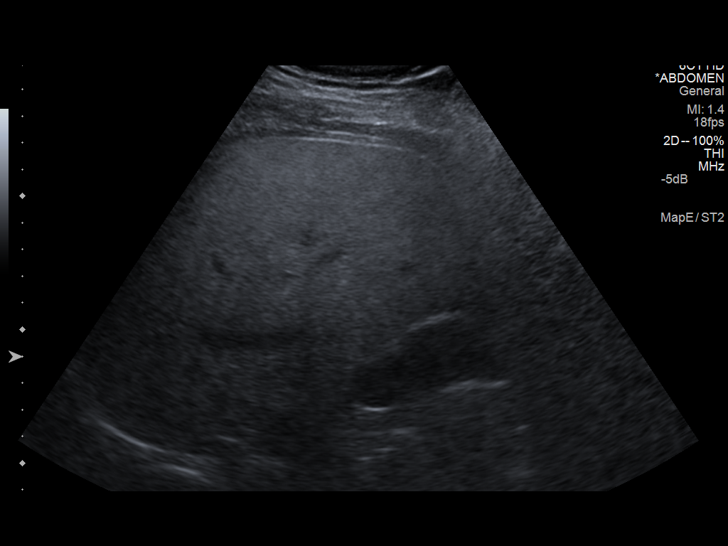
[im 49/74]
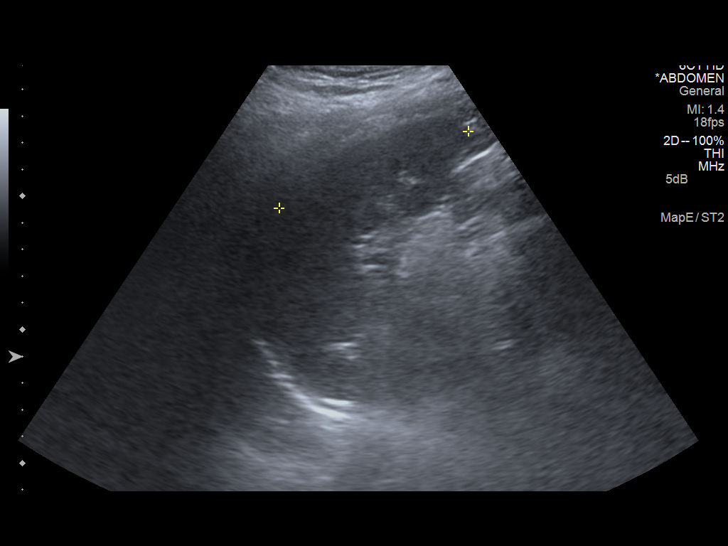
[im 55/74]
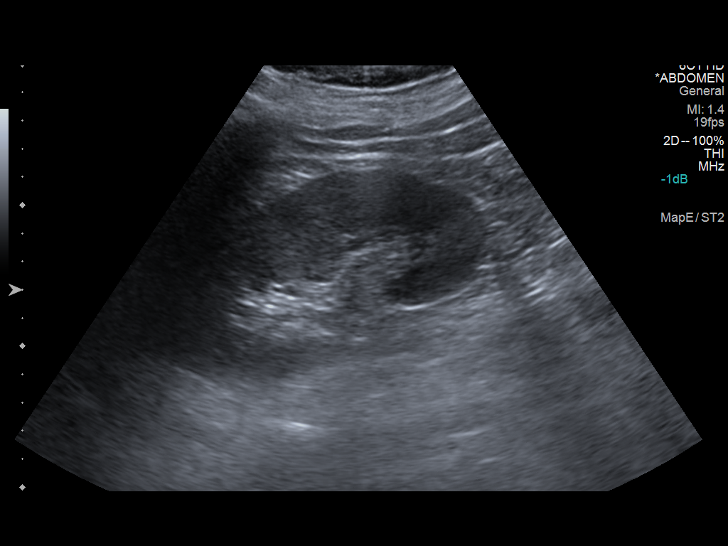
[im 61/74]
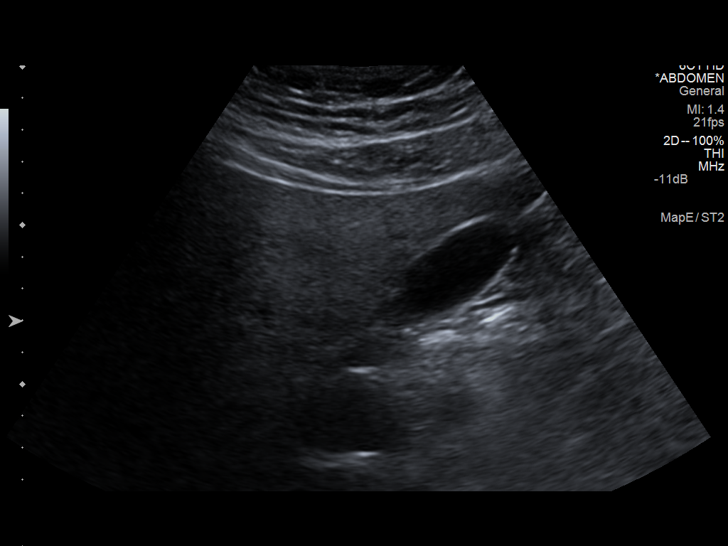
[im 67/74]
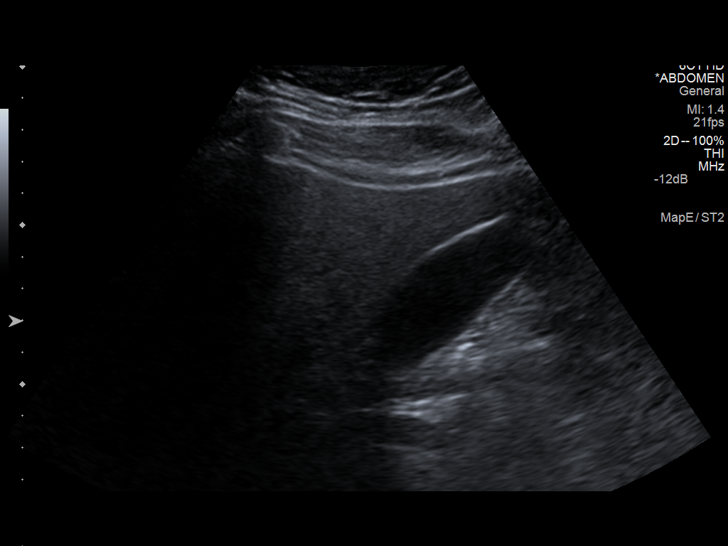
[im 74/74]
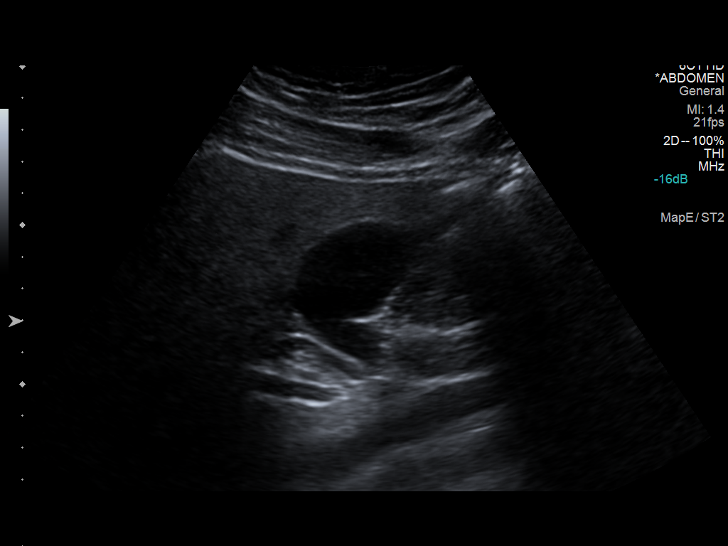

[14 of 25 positions shown; findings below may reference images not displayed]

FINDINGS: Gallbladder:

No gallstones or wall thickening visualized. No sonographic Murphy
sign noted.

Common bile duct:

Diameter: 4 mm

Liver:

No focal lesion identified. Diffusely echogenic compatible with
fatty infiltration.

IVC:

No abnormality visualized.

Pancreas:

Visualized portion unremarkable.

Spleen:

Size and appearance within normal limits.

Right Kidney:

Length: 11.9 cm. Echogenicity within normal limits. No mass or
hydronephrosis visualized.

Left Kidney:

Length: 12.9 cm. Echogenicity within normal limits. No mass or
hydronephrosis visualized.

Abdominal aorta:

No aneurysm visualized.

Other findings:

None.
IMPRESSION: 1. Hepatic steatosis.

## 2014-10-29 ENCOUNTER — Ambulatory Visit: Payer: 59

## 2014-10-29 ENCOUNTER — Ambulatory Visit (INDEPENDENT_AMBULATORY_CARE_PROVIDER_SITE_OTHER): Payer: 59

## 2014-10-29 DIAGNOSIS — Z23 Encounter for immunization: Secondary | ICD-10-CM

## 2015-01-01 ENCOUNTER — Encounter: Payer: Self-pay | Admitting: Internal Medicine

## 2015-01-01 ENCOUNTER — Other Ambulatory Visit (INDEPENDENT_AMBULATORY_CARE_PROVIDER_SITE_OTHER): Payer: 59

## 2015-01-01 ENCOUNTER — Ambulatory Visit (INDEPENDENT_AMBULATORY_CARE_PROVIDER_SITE_OTHER): Payer: 59 | Admitting: Internal Medicine

## 2015-01-01 VITALS — BP 114/80 | HR 86 | Temp 98.2°F | Ht 68.0 in | Wt 235.4 lb

## 2015-01-01 DIAGNOSIS — R7302 Impaired glucose tolerance (oral): Secondary | ICD-10-CM

## 2015-01-01 DIAGNOSIS — Z0000 Encounter for general adult medical examination without abnormal findings: Secondary | ICD-10-CM

## 2015-01-01 DIAGNOSIS — I10 Essential (primary) hypertension: Secondary | ICD-10-CM

## 2015-01-01 DIAGNOSIS — E785 Hyperlipidemia, unspecified: Secondary | ICD-10-CM

## 2015-01-01 LAB — CBC WITH DIFFERENTIAL/PLATELET
BASOS ABS: 0 10*3/uL (ref 0.0–0.1)
BASOS PCT: 0.5 % (ref 0.0–3.0)
EOS PCT: 1.8 % (ref 0.0–5.0)
Eosinophils Absolute: 0.2 10*3/uL (ref 0.0–0.7)
HCT: 48.1 % (ref 39.0–52.0)
Hemoglobin: 14.9 g/dL (ref 13.0–17.0)
Lymphocytes Relative: 23.2 % (ref 12.0–46.0)
Lymphs Abs: 2.2 10*3/uL (ref 0.7–4.0)
MCHC: 31 g/dL (ref 30.0–36.0)
MCV: 74.7 fl — ABNORMAL LOW (ref 78.0–100.0)
Monocytes Absolute: 0.7 10*3/uL (ref 0.1–1.0)
Monocytes Relative: 6.9 % (ref 3.0–12.0)
NEUTROS PCT: 67.6 % (ref 43.0–77.0)
Neutro Abs: 6.4 10*3/uL (ref 1.4–7.7)
PLATELETS: 261 10*3/uL (ref 150.0–400.0)
RBC: 6.44 Mil/uL — ABNORMAL HIGH (ref 4.22–5.81)
RDW: 13.5 % (ref 11.5–15.5)
WBC: 9.5 10*3/uL (ref 4.0–10.5)

## 2015-01-01 LAB — URINALYSIS, ROUTINE W REFLEX MICROSCOPIC
HGB URINE DIPSTICK: NEGATIVE
KETONES UR: NEGATIVE
Leukocytes, UA: NEGATIVE
Nitrite: NEGATIVE
Specific Gravity, Urine: >=1.03 — AB (ref 1.000–1.030)
Urine Glucose: NEGATIVE
Urobilinogen, UA: 0.2 (ref 0.0–1.0)
pH: 6 (ref 5.0–8.0)

## 2015-01-01 LAB — BASIC METABOLIC PANEL
BUN: 11 mg/dL (ref 6–23)
CO2: 25 mEq/L (ref 19–32)
Calcium: 9.4 mg/dL (ref 8.4–10.5)
Chloride: 102 mEq/L (ref 96–112)
Creatinine, Ser: 1 mg/dL (ref 0.4–1.5)
GFR: 103.95 mL/min (ref >60.00)
Glucose, Bld: 162 mg/dL — ABNORMAL HIGH (ref 70–99)
Potassium: 4 mEq/L (ref 3.5–5.1)
Sodium: 137 mEq/L (ref 135–145)

## 2015-01-01 LAB — LIPID PANEL
CHOL/HDL RATIO: 8
CHOLESTEROL: 270 mg/dL — AB (ref 0–200)
HDL: 31.8 mg/dL — ABNORMAL LOW (ref >39.00)
NONHDL: 238.2
Triglycerides: 449 mg/dL — ABNORMAL HIGH (ref 0.0–149.0)
VLDL: 89.8 mg/dL — ABNORMAL HIGH (ref 0.0–40.0)

## 2015-01-01 LAB — LDL CHOLESTEROL, DIRECT: Direct LDL: 145.1 mg/dL

## 2015-01-01 LAB — HEPATIC FUNCTION PANEL
ALBUMIN: 4.5 g/dL (ref 3.5–5.2)
ALT: 129 U/L — AB (ref 0–53)
AST: 53 U/L — ABNORMAL HIGH (ref 0–37)
Alkaline Phosphatase: 75 U/L (ref 39–117)
Bilirubin, Direct: 0.1 mg/dL (ref 0.0–0.3)
Total Bilirubin: 1.1 mg/dL (ref 0.2–1.2)
Total Protein: 8 g/dL (ref 6.0–8.3)

## 2015-01-01 LAB — HEMOGLOBIN A1C: HEMOGLOBIN A1C: 6.8 % — AB (ref 4.6–6.5)

## 2015-01-01 LAB — TSH: TSH: 1.58 u[IU]/mL (ref 0.35–4.50)

## 2015-01-01 MED ORDER — LOSARTAN POTASSIUM 25 MG PO TABS: 25.0000 mg | ORAL_TABLET | Freq: Every day | ORAL | Status: DC

## 2015-01-01 NOTE — Patient Instructions (Addendum)
OK to decrease the losartan to 25 mg per day, and stop the previous medication  Please continue all other medications as before, and refills have been done if requested.  Please have the pharmacy call with any other refills you may need.  Please continue your efforts at being more active, low cholesterol diet, and weight control.  You are otherwise up to date with prevention measures today.  Please keep your appointments with your specialists as you may have planned  Please go to the LAB in the Basement (turn left off the elevator) for the tests to be done today  You will be contacted by phone if any changes need to be made immediately.  Otherwise, you will receive a letter about your results with an explanation, but please check with MyChart first.  Please remember to sign up for MyChart if you have not done so, as this will be important to you in the future with finding out test results, communicating by private email, and scheduling acute appointments online when needed.  Please return in 1 year for your yearly visit, or sooner if needed, with Lab testing done 3-5 days before

## 2015-01-01 NOTE — Assessment & Plan Note (Signed)

## 2015-01-01 NOTE — Progress Notes (Signed)
Subjective:    Patient ID: Jerry Stokes, male    DOB: 1991/05/19, 24 y.o.   MRN: [id number]  HPI  Here for wellness and f/u;  Overall doing ok;  Pt denies CP, worsening SOB, DOE, wheezing, orthopnea, PND, worsening LE edema, palpitations, dizziness or syncope.  Pt denies neurological change such as new headache, facial or extremity weakness.  Pt denies polydipsia, polyuria, or low sugar symptoms. Pt states overall good compliance with treatment and medications, good tolerability, and has been trying to follow lower cholesterol diet.  Pt denies worsening depressive symptoms, suicidal ideation or panic. No fever, night sweats, wt loss, loss of appetite, or other constitutional symptoms.  Pt states good ability with ADL's, has low fall risk, home safety reviewed and adequate, no other significant changes in hearing or vision, and only occasionally active with exercise.  Has gained wt about 5 per yr the last 2 yrs.  Brother with MI at 33yo, mother very concerned, asks for statin if chol still elevated  Pt noncompliant with BP med for most part Past Medical History  Diagnosis Date  . Hypertension   . Depression   . Hyperlipidemia   . Fistula     Pelvic  . Impaired glucose tolerance 12/12/2011  . Alpha thalassemia trait     several in family, as well as mother   Past Surgical History  Procedure Laterality Date  . Cystectomy  1997    Under tongue  . Exploratory laparotomy  2001    Suprapubic fistula repair age 67    reports that he has never smoked. He does not have any smokeless tobacco history on file. He reports that he does not drink alcohol or use illicit drugs. family history includes Diabetes in his father; Hyperlipidemia in his father; Hypertension in his other; Stroke in his other. Allergies  Allergen Reactions  . Enalapril Maleate     REACTION: cough   Current Outpatient Prescriptions on File Prior to Visit  Medication Sig Dispense Refill  . losartan-hydrochlorothiazide  (HYZAAR) 100-12.5 MG per tablet Take 1 tablet by mouth daily. 90 tablet 3   No current facility-administered medications on file prior to visit.   Review of Systems Constitutional: Negative for increased diaphoresis, other activity, appetite or other siginficant weight change  HENT: Negative for worsening hearing loss, ear pain, facial swelling, mouth sores and neck stiffness.   Eyes: Negative for other worsening pain, redness or visual disturbance.  Respiratory: Negative for shortness of breath and wheezing.   Cardiovascular: Negative for chest pain and palpitations.  Gastrointestinal: Negative for diarrhea, blood in stool, abdominal distention or other pain Genitourinary: Negative for hematuria, flank pain or change in urine volume.  Musculoskeletal: Negative for myalgias or other joint complaints.  Skin: Negative for color change and wound.  Neurological: Negative for syncope and numbness. other than noted Hematological: Negative for adenopathy. or other swelling Psychiatric/Behavioral: Negative for hallucinations, self-injury, decreased concentration or other worsening agitation.      Objective:   Physical Exam BP 114/80 mmHg  Pulse 86  Temp(Src) 98.2 F (36.8 C) (Oral)  Ht  (1.727 m)  Wt 235 lb 6 oz (106.765 kg)  BMI 35.80 kg/m2  SpO2 97% VS noted,  Constitutional: Pt is oriented to person, place, and time. Appears well-developed and well-nourished.  Head: Normocephalic and atraumatic.  Right Ear: External ear normal.  Left Ear: External ear normal.  Nose: Nose normal.  Mouth/Throat: Oropharynx is clear and moist.  Eyes: Conjunctivae and EOM are normal.  Pupils are equal, round, and reactive to light.  Neck: Normal range of motion. Neck supple. No JVD present. No tracheal deviation present.  Cardiovascular: Normal rate, regular rhythm, normal heart sounds and intact distal pulses.   Pulmonary/Chest: Effort normal and breath sounds without rales or wheezing  Abdominal:  Soft. Bowel sounds are normal. NT. No HSM  Musculoskeletal: Normal range of motion. Exhibits no edema.  Lymphadenopathy:  Has no cervical adenopathy.  Neurological: Pt is alert and oriented to person, place, and time. Pt has normal reflexes. No cranial nerve deficit. Motor grossly intact Skin: Skin is warm and dry. No rash noted.  Psychiatric:  Has normal mood and affect. Behavior is normal.   Wt Readings from Last 3 Encounters:  01/01/15 235 lb 6 oz (106.765 kg)  12/24/13 229 lb (103.874 kg)  12/17/12 224 lb (101.606 kg)       Assessment & Plan:

## 2015-01-01 NOTE — Progress Notes (Signed)
Pre visit review using our clinic review tool, if applicable. No additional management support is needed unless otherwise documented below in the visit note. 

## 2015-01-01 NOTE — Assessment & Plan Note (Signed)
?   Overcontrolled, to change losartan to 25 mg qd

## 2015-01-01 NOTE — Assessment & Plan Note (Signed)
Asympt, for a1c 

## 2015-01-01 NOTE — Assessment & Plan Note (Signed)
Consider statin due to brother with MI at 24 yo

## 2015-01-05 ENCOUNTER — Other Ambulatory Visit: Payer: Self-pay | Admitting: Internal Medicine

## 2015-01-05 DIAGNOSIS — E119 Type 2 diabetes mellitus without complications: Secondary | ICD-10-CM

## 2015-01-05 MED ORDER — METFORMIN HCL ER 500 MG PO TB24: 500.0000 mg | ORAL_TABLET | Freq: Every day | ORAL | Status: DC

## 2015-01-05 MED ORDER — ATORVASTATIN CALCIUM 20 MG PO TABS: 20.0000 mg | ORAL_TABLET | Freq: Every day | ORAL | Status: DC

## 2015-07-20 ENCOUNTER — Ambulatory Visit: Payer: 59 | Admitting: Internal Medicine

## 2015-10-26 ENCOUNTER — Ambulatory Visit (INDEPENDENT_AMBULATORY_CARE_PROVIDER_SITE_OTHER): Payer: 59

## 2015-10-26 DIAGNOSIS — Z23 Encounter for immunization: Secondary | ICD-10-CM

## 2016-01-12 ENCOUNTER — Telehealth: Payer: Self-pay | Admitting: Internal Medicine

## 2016-01-12 NOTE — Telephone Encounter (Signed)
Patient is coming in for cpe in feb.  Please make sure labs are still good for patient to come in early.  Please follow up with mom.

## 2016-01-13 NOTE — Telephone Encounter (Signed)
Orders have been done

## 2016-01-13 NOTE — Telephone Encounter (Signed)
Patient is coming in for CPE in February. Can the labs be ordered ahead of time so that he can come in before hand. Please advise.

## 2016-01-13 NOTE — Telephone Encounter (Signed)
Pt/mother advised via VM

## 2016-02-03 ENCOUNTER — Telehealth: Payer: Self-pay | Admitting: Internal Medicine

## 2016-02-03 ENCOUNTER — Other Ambulatory Visit: Payer: Self-pay | Admitting: Internal Medicine

## 2016-02-03 MED FILL — ATORVASTATIN 20 MG TABLET: 20 | 90 days supply | Qty: 90 | Fill #0

## 2016-02-03 MED FILL — METFORMIN HCL ER 500 MG TAB: 500 | 90 days supply | Qty: 90 | Fill #0

## 2016-02-03 NOTE — Telephone Encounter (Signed)
Please advise 

## 2016-02-03 NOTE — Telephone Encounter (Signed)
Ok to hold on refill medication - to pharmacy  Pl is due for yearly f/u - to patient  Corinne to let both know please

## 2016-02-03 NOTE — Telephone Encounter (Signed)
Jerry Stokes from [facility] is questioning if the losartan-hydrochlorothiazide (HYZAAR) 100-12.5 MG tablet [[id number]]  Was supposed to be written. He states they haven't filled that in over 2 years. Can you please call him

## 2016-02-10 ENCOUNTER — Encounter: Payer: 59 | Admitting: Internal Medicine

## 2016-02-11 MED ORDER — LOSARTAN POTASSIUM 25 MG PO TABS: 25.0000 mg | ORAL_TABLET | Freq: Every day | ORAL | Status: DC

## 2016-02-11 NOTE — Telephone Encounter (Signed)
Ok, this has been done 

## 2016-02-11 NOTE — Telephone Encounter (Signed)
Patient's mother is calling to request that we fill cozaar RX until patients appt on 03/23/2016. This was the next available CPE after having to reschedule the patient last Thursday in dr Melvyn Novas absence.

## 2016-02-11 NOTE — Telephone Encounter (Signed)
Please advise 

## 2016-02-23 MED FILL — LOSARTAN POTASSIUM 25 MG TA: 25 | 90 days supply | Qty: 90 | Fill #0

## 2016-03-23 ENCOUNTER — Other Ambulatory Visit (INDEPENDENT_AMBULATORY_CARE_PROVIDER_SITE_OTHER): Payer: 59

## 2016-03-23 ENCOUNTER — Encounter: Payer: Self-pay | Admitting: Internal Medicine

## 2016-03-23 ENCOUNTER — Ambulatory Visit (INDEPENDENT_AMBULATORY_CARE_PROVIDER_SITE_OTHER): Payer: 59 | Admitting: Internal Medicine

## 2016-03-23 ENCOUNTER — Other Ambulatory Visit: Payer: Self-pay | Admitting: Internal Medicine

## 2016-03-23 VITALS — BP 140/90 | HR 82 | Temp 98.5°F | Resp 20 | Wt 235.0 lb

## 2016-03-23 DIAGNOSIS — I10 Essential (primary) hypertension: Secondary | ICD-10-CM | POA: Diagnosis not present

## 2016-03-23 DIAGNOSIS — E119 Type 2 diabetes mellitus without complications: Secondary | ICD-10-CM | POA: Diagnosis not present

## 2016-03-23 DIAGNOSIS — Z23 Encounter for immunization: Secondary | ICD-10-CM

## 2016-03-23 DIAGNOSIS — Z0000 Encounter for general adult medical examination without abnormal findings: Secondary | ICD-10-CM

## 2016-03-23 DIAGNOSIS — R7989 Other specified abnormal findings of blood chemistry: Secondary | ICD-10-CM

## 2016-03-23 DIAGNOSIS — E785 Hyperlipidemia, unspecified: Secondary | ICD-10-CM

## 2016-03-23 DIAGNOSIS — R945 Abnormal results of liver function studies: Secondary | ICD-10-CM

## 2016-03-23 DIAGNOSIS — H5350 Unspecified color vision deficiencies: Secondary | ICD-10-CM | POA: Insufficient documentation

## 2016-03-23 LAB — CBC WITH DIFFERENTIAL/PLATELET
Basophils Absolute: 0 10*3/uL (ref 0.0–0.1)
Basophils Relative: 0.5 % (ref 0.0–3.0)
EOS PCT: 1.8 % (ref 0.0–5.0)
Eosinophils Absolute: 0.2 10*3/uL (ref 0.0–0.7)
HCT: 45.4 % (ref 39.0–52.0)
Hemoglobin: 14.6 g/dL (ref 13.0–17.0)
LYMPHS ABS: 2.7 10*3/uL (ref 0.7–4.0)
Lymphocytes Relative: 29.7 % (ref 12.0–46.0)
MCHC: 32.2 g/dL (ref 30.0–36.0)
MCV: 72.3 fl — ABNORMAL LOW (ref 78.0–100.0)
MONO ABS: 0.7 10*3/uL (ref 0.1–1.0)
Monocytes Relative: 7.4 % (ref 3.0–12.0)
Neutro Abs: 5.5 10*3/uL (ref 1.4–7.7)
Neutrophils Relative %: 60.6 % (ref 43.0–77.0)
Platelets: 278 10*3/uL (ref 150.0–400.0)
RBC: 6.28 Mil/uL — AB (ref 4.22–5.81)
RDW: 13.4 % (ref 11.5–15.5)
WBC: 9.1 10*3/uL (ref 4.0–10.5)

## 2016-03-23 LAB — URINALYSIS, ROUTINE W REFLEX MICROSCOPIC
BILIRUBIN URINE: NEGATIVE
HGB URINE DIPSTICK: NEGATIVE
KETONES UR: NEGATIVE
LEUKOCYTES UA: NEGATIVE
NITRITE: NEGATIVE
RBC / HPF: NONE SEEN (ref 0–?)
Specific Gravity, Urine: >=1.03 — AB (ref 1.000–1.030)
Total Protein, Urine: NEGATIVE
UROBILINOGEN UA: 0.2 (ref 0.0–1.0)
Urine Glucose: NEGATIVE
pH: 5.5 (ref 5.0–8.0)

## 2016-03-23 LAB — HEPATIC FUNCTION PANEL
ALBUMIN: 4.6 g/dL (ref 3.5–5.2)
ALK PHOS: 66 U/L (ref 39–117)
ALT: 124 U/L — ABNORMAL HIGH (ref 0–53)
AST: 64 U/L — AB (ref 0–37)
BILIRUBIN DIRECT: 0.1 mg/dL (ref 0.0–0.3)
TOTAL PROTEIN: 7.7 g/dL (ref 6.0–8.3)
Total Bilirubin: 0.7 mg/dL (ref 0.2–1.2)

## 2016-03-23 LAB — LIPID PANEL
Cholesterol: 207 mg/dL — ABNORMAL HIGH (ref 0–200)
HDL: 37.8 mg/dL — ABNORMAL LOW (ref >39.00)
NONHDL: 169.47
Total CHOL/HDL Ratio: 5
Triglycerides: 316 mg/dL — ABNORMAL HIGH (ref 0.0–149.0)
VLDL: 63.2 mg/dL — ABNORMAL HIGH (ref 0.0–40.0)

## 2016-03-23 LAB — MICROALBUMIN / CREATININE URINE RATIO
CREATININE, U: 255.5 mg/dL
MICROALB UR: 2.1 mg/dL — AB (ref 0.0–1.9)
MICROALB/CREAT RATIO: 0.8 mg/g (ref 0.0–30.0)

## 2016-03-23 LAB — BASIC METABOLIC PANEL
BUN: 11 mg/dL (ref 6–23)
CO2: 28 meq/L (ref 19–32)
Calcium: 9.9 mg/dL (ref 8.4–10.5)
Chloride: 101 mEq/L (ref 96–112)
Creatinine, Ser: 1.03 mg/dL (ref 0.40–1.50)
GFR: 93.72 mL/min (ref >60.00)
GLUCOSE: 155 mg/dL — AB (ref 70–99)
POTASSIUM: 3.8 meq/L (ref 3.5–5.1)
SODIUM: 137 meq/L (ref 135–145)

## 2016-03-23 LAB — HEMOGLOBIN A1C: HEMOGLOBIN A1C: 7 % — AB (ref 4.6–6.5)

## 2016-03-23 LAB — LDL CHOLESTEROL, DIRECT: LDL DIRECT: 110 mg/dL

## 2016-03-23 LAB — TSH: TSH: 1.77 u[IU]/mL (ref 0.35–4.50)

## 2016-03-23 MED ORDER — LOSARTAN POTASSIUM-HCTZ 100-12.5 MG PO TABS: 1.0000 | ORAL_TABLET | Freq: Every day | ORAL | Status: DC

## 2016-03-23 MED ORDER — ATORVASTATIN CALCIUM 40 MG PO TABS: 40.0000 mg | ORAL_TABLET | Freq: Every day | ORAL | Status: DC

## 2016-03-23 MED ORDER — METFORMIN HCL ER 500 MG PO TB24: 500.0000 mg | ORAL_TABLET | Freq: Every day | ORAL | Status: DC

## 2016-03-23 MED ORDER — ATORVASTATIN CALCIUM 20 MG PO TABS: 20.0000 mg | ORAL_TABLET | Freq: Every day | ORAL | Status: DC

## 2016-03-23 MED ORDER — METFORMIN HCL ER 500 MG PO TB24: ORAL_TABLET | ORAL | Status: DC

## 2016-03-23 MED FILL — LOSARTAN-HCTZ 100-12.5 MG T: 100-12.5 | 90 days supply | Qty: 90 | Fill #0

## 2016-03-23 NOTE — Progress Notes (Signed)
Pre visit review using our clinic review tool, if applicable. No additional management support is needed unless otherwise documented below in the visit note. 

## 2016-03-23 NOTE — Assessment & Plan Note (Signed)
Currently on low dose cozaar only, will change back to previous losartan/hct, cont to monitor,  to f/u any worsening symptoms or concerns

## 2016-03-23 NOTE — Addendum Note (Signed)
Addended by: Etheleen MayhewOX, HEATHER C on: 03/23/2016 01:15 PM   Modules accepted: Orders

## 2016-03-23 NOTE — Assessment & Plan Note (Signed)
stable overall by history and exam, recent data reviewed with pt, and pt to continue medical treatment as before,  to f/u any worsening symptoms or concerns Lab Results  Component Value Date   CHOL 270* 01/01/2015   HDL 31.80* 01/01/2015   LDLDIRECT 145.1 01/01/2015   TRIG 449.0* 01/01/2015   CHOLHDL 8 01/01/2015    For f/u on new statin

## 2016-03-23 NOTE — Assessment & Plan Note (Addendum)
stable overall by history and exam, recent data reviewed with pt, and pt to continue medical treatment as before,  to f/u any worsening symptoms or concerns Lab Results  Component Value Date   HGBA1C 6.8* 01/01/2015   For fu on new metformin  In addition to the time spent performing CPE, I spent an additional 15 minutes face to face,in which greater than 50% of this time was spent in counseling and coordination of care for patient's acute illness as documented.

## 2016-03-23 NOTE — Progress Notes (Signed)
Subjective:    Patient ID: Jerry Stokes, male    DOB: 1991/09/23, 25 y.o.   MRN: [id number]  HPI  Here for wellness and f/u;  Overall doing ok;  Pt denies Chest pain, worsening SOB, DOE, wheezing, orthopnea, PND, worsening LE edema, palpitations, dizziness or syncope.  Pt denies neurological change such as new headache, facial or extremity weakness.  Pt denies polydipsia, polyuria, or low sugar symptoms. Pt states overall good compliance with treatment and medications, good tolerability, and has been trying to follow appropriate diet.  Pt denies worsening depressive symptoms, suicidal ideation or panic. No fever, night sweats, wt loss, loss of appetite, or other constitutional symptoms.  Pt states good ability with ADL's, has low fall risk, home safety reviewed and adequate, no other significant changes in hearing or vision, and only occasionally active with exercise. No specific complaints Wt Readings from Last 3 Encounters:  03/23/16 235 lb (106.595 kg)  01/01/15 235 lb 6 oz (106.765 kg)  12/24/13 229 lb (103.874 kg)   Past Medical History  Diagnosis Date  . Hypertension   . Depression   . Hyperlipidemia   . Fistula     Pelvic  . Impaired glucose tolerance 12/12/2011  . Alpha thalassemia trait     several in family, as well as mother   Past Surgical History  Procedure Laterality Date  . Cystectomy  1997    Under tongue  . Exploratory laparotomy  2001    Suprapubic fistula repair age 50    reports that he has never smoked. He does not have any smokeless tobacco history on file. He reports that he does not drink alcohol or use illicit drugs. family history includes Diabetes in his father; Hyperlipidemia in his father; Hypertension in his other; Stroke in his other. Allergies  Allergen Reactions  . Enalapril Maleate     REACTION: cough   Current Outpatient Prescriptions on File Prior to Visit  Medication Sig Dispense Refill  . atorvastatin (LIPITOR) 20 MG tablet TAKE 1 TABLET  BY MOUTH ONCE DAILY 90 tablet 3  . losartan-hydrochlorothiazide (HYZAAR) 100-12.5 MG tablet TAKE 1 TABLET BY MOUTH DAILY. 90 tablet 3  . metFORMIN (GLUCOPHAGE-XR) 500 MG 24 hr tablet TAKE 1 TABLET BY MOUTH DAILY WITH BREAKFAST 90 tablet 3   No current facility-administered medications on file prior to visit.   Review of Systems Constitutional: Negative for increased diaphoresis, or other activity, appetite or siginficant weight change other than noted HENT: Negative for worsening hearing loss, ear pain, facial swelling, mouth sores and neck stiffness.   Eyes: Negative for other worsening pain, redness or visual disturbance.  Respiratory: Negative for choking or stridor Cardiovascular: Negative for other chest pain and palpitations.  Gastrointestinal: Negative for worsening diarrhea, blood in stool, or abdominal distention Genitourinary: Negative for hematuria, flank pain or change in urine volume.  Musculoskeletal: Negative for myalgias or other joint complaints.  Skin: Negative for other color change and wound or drainage.  Neurological: Negative for syncope and numbness. other than noted Hematological: Negative for adenopathy. or other swelling Psychiatric/Behavioral: Negative for hallucinations, SI, self-injury, decreased concentration or other worsening agitation.      Objective:   Physical Exam BP 140/90 mmHg  Pulse 82  Temp(Src) 98.5 F (36.9 C) (Oral)  Resp 20  Wt 235 lb (106.595 kg)  SpO2 97% VS noted,  Constitutional: Pt is oriented to person, place, and time. Appears well-developed and well-nourished, in no significant distress Head: Normocephalic and atraumatic  Eyes: Conjunctivae and  EOM are normal. Pupils are equal, round, and reactive to light Right Ear: External ear normal.  Left Ear: External ear normal Nose: Nose normal.  Mouth/Throat: Oropharynx is clear and moist  Neck: Normal range of motion. Neck supple. No JVD present. No tracheal deviation present or  significant neck LA or mass Cardiovascular: Normal rate, regular rhythm, normal heart sounds and intact distal pulses.   Pulmonary/Chest: Effort normal and breath sounds without rales or wheezing  Abdominal: Soft. Bowel sounds are normal. NT. No HSM  Musculoskeletal: Normal range of motion. Exhibits no edema Lymphadenopathy: Has no cervical adenopathy.  Neurological: Pt is alert and oriented to person, place, and time. Pt has normal reflexes. No cranial nerve deficit. Motor grossly intact Skin: Skin is warm and dry. No rash noted or new ulcers Psychiatric:  Has blunt mood and affect. Behavior is normal.    Assessment & Plan:

## 2016-03-23 NOTE — Assessment & Plan Note (Signed)

## 2016-03-23 NOTE — Patient Instructions (Signed)
You had the new Prevnar pneumonia shot  Please continue all other medications as before, and refills have been done if requested.  Please have the pharmacy call with any other refills you may need.  Please continue your efforts at being more active, low cholesterol diet, and weight control.  You are otherwise up to date with prevention measures today.  Please keep your appointments with your specialists as you may have planned  Please go to the LAB in the Basement (turn left off the elevator) for the tests to be done today  You will be contacted by phone if any changes need to be made immediately.  Otherwise, you will receive a letter about your results with an explanation, but please check with MyChart first.  Please remember to sign up for MyChart if you have not done so, as this will be important to you in the future with finding out test results, communicating by private email, and scheduling acute appointments online when needed.  Please return in 6 months, or sooner if needed, with Lab testing done 3-5 days before

## 2016-03-24 ENCOUNTER — Other Ambulatory Visit: Payer: Self-pay | Admitting: Internal Medicine

## 2016-03-24 DIAGNOSIS — R7989 Other specified abnormal findings of blood chemistry: Secondary | ICD-10-CM

## 2016-03-24 DIAGNOSIS — R945 Abnormal results of liver function studies: Secondary | ICD-10-CM

## 2016-05-10 MED FILL — METFORMIN HCL ER 500 MG TAB: 500 | 90 days supply | Qty: 180 | Fill #0

## 2016-05-10 MED FILL — ATORVASTATIN 40 MG TABLET: 40 | 90 days supply | Qty: 90 | Fill #0

## 2016-05-12 ENCOUNTER — Ambulatory Visit
Admission: RE | Admit: 2016-05-12 | Discharge: 2016-05-12 | Disposition: A | Discharge status: Home / Self Care | Payer: 59 | Source: Ambulatory Visit | Attending: Internal Medicine | Admitting: Internal Medicine

## 2016-05-12 DIAGNOSIS — R945 Abnormal results of liver function studies: Secondary | ICD-10-CM

## 2016-05-12 DIAGNOSIS — R7989 Other specified abnormal findings of blood chemistry: Secondary | ICD-10-CM

## 2016-06-30 MED FILL — LOSARTAN-HCTZ 100-12.5 MG T: 100-12.5 | 90 days supply | Qty: 90 | Fill #1

## 2016-08-21 MED FILL — ATORVASTATIN 40 MG TABLET: 40 | 90 days supply | Qty: 90 | Fill #1

## 2016-08-21 MED FILL — METFORMIN HCL ER 500 MG TAB: 500 | 90 days supply | Qty: 180 | Fill #1

## 2016-09-27 ENCOUNTER — Ambulatory Visit: Payer: 59 | Admitting: Internal Medicine

## 2016-10-11 MED FILL — LOSARTAN-HCTZ 100-12.5 MG T: 100-12.5 | 90 days supply | Qty: 90 | Fill #2

## 2016-11-15 MED FILL — METFORMIN HCL ER 500 MG TAB: 500 | 90 days supply | Qty: 180 | Fill #2

## 2016-11-15 MED FILL — ATORVASTATIN 40 MG TABLET: 40 | 90 days supply | Qty: 90 | Fill #2

## 2016-11-24 IMAGING — US US ABDOMEN COMPLETE
1 series · 14 of 25 positions shown · non-contrast
Comparison: 12/31/2013

CLINICAL DATA: Elevated liver function tests.

EXAM:
ABDOMEN ULTRASOUND COMPLETE

[Series 1: us abdomen complete · 0.35mm/px · 14 of 77 slices shown]
[im 1/77]
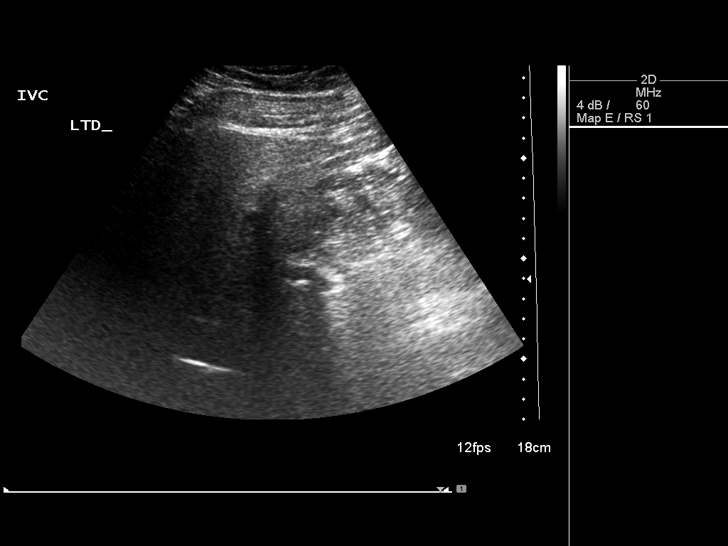
[im 7/77]
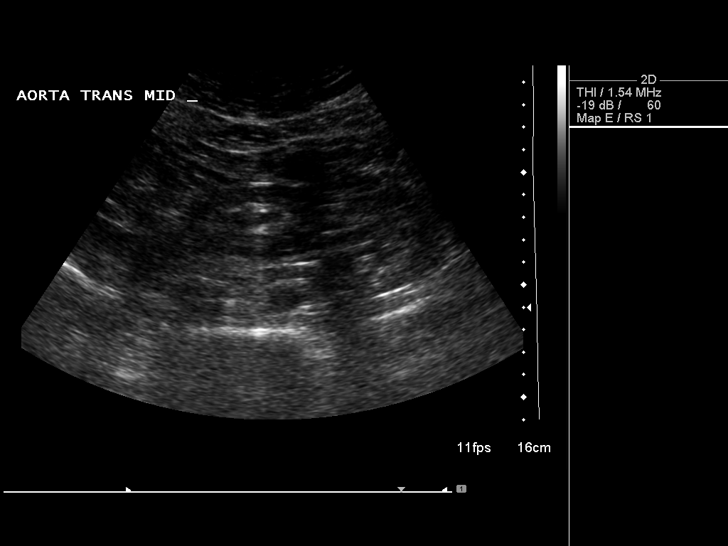
[im 13/77]
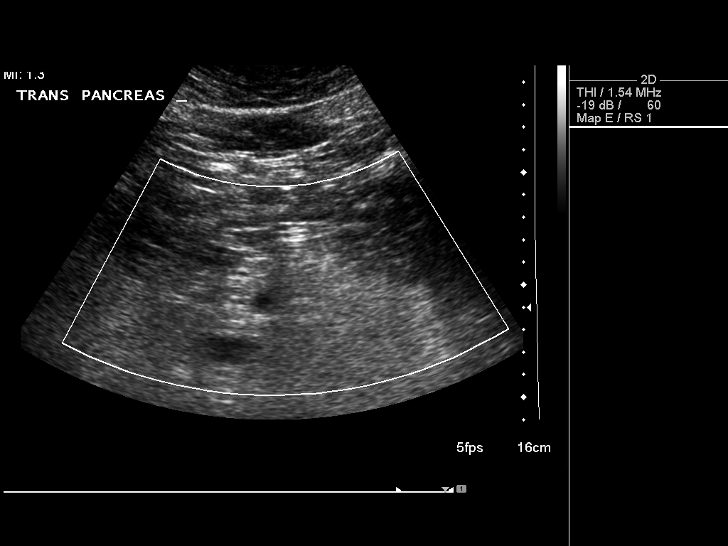
[im 20/77]
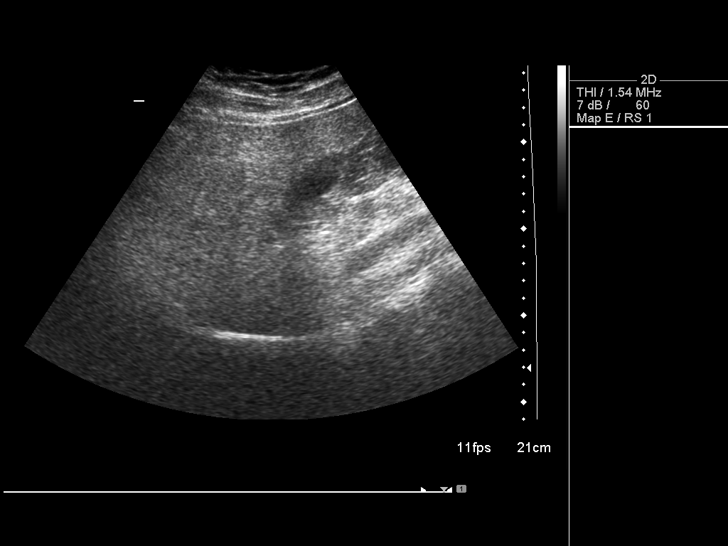
[im 26/77]
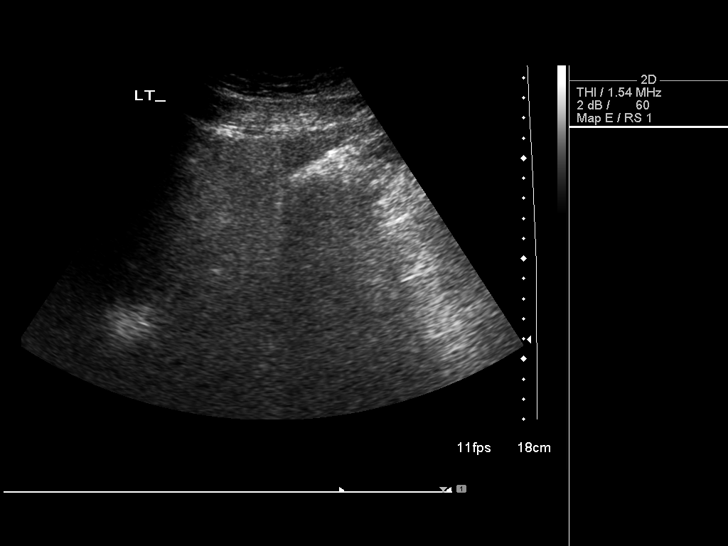
[im 29/77]
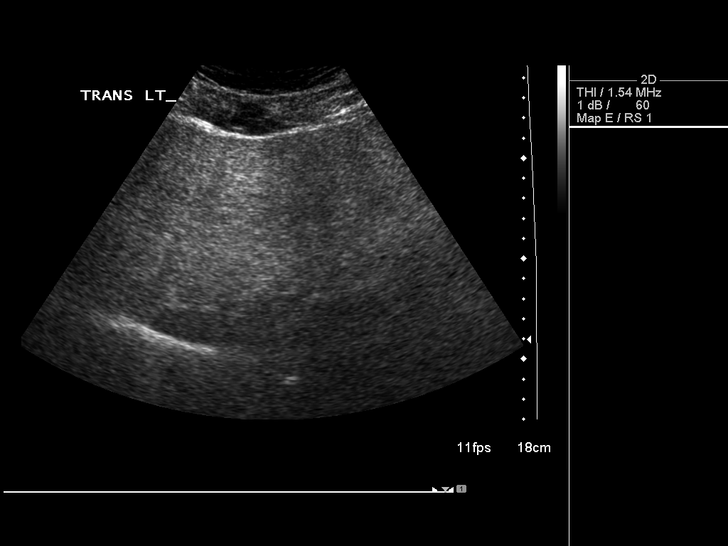
[im 35/77]
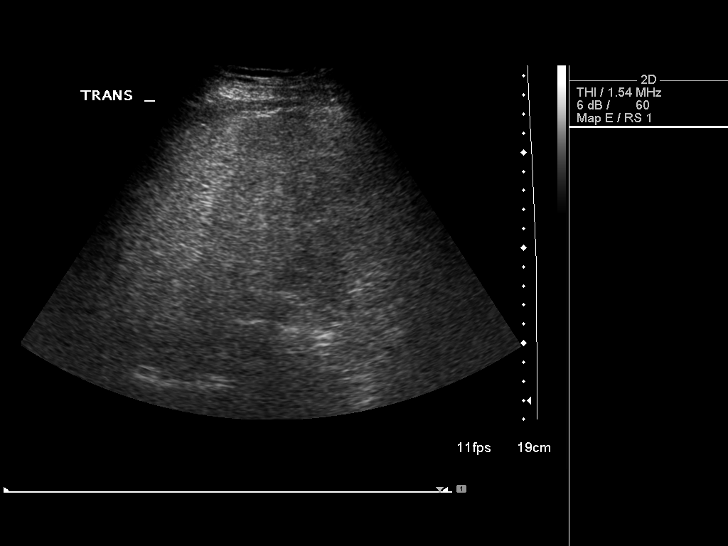
[im 42/77]
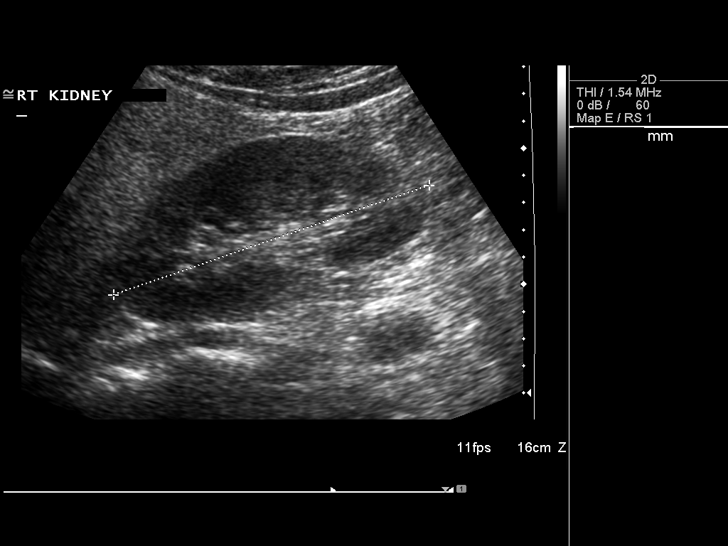
[im 48/77]
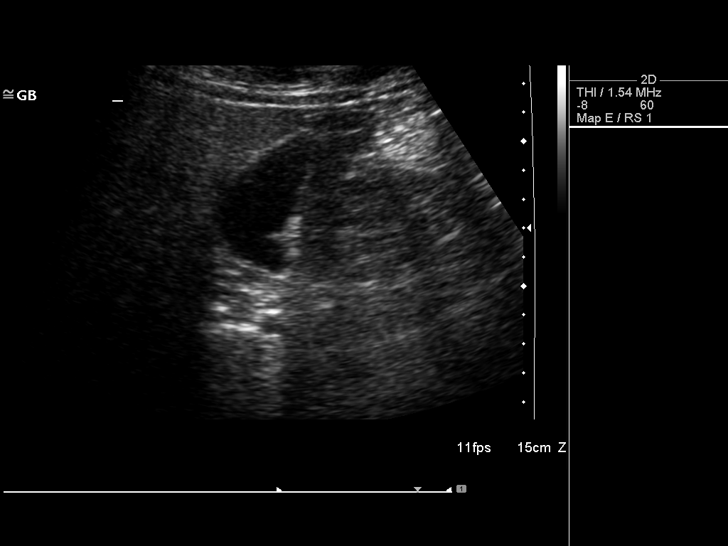
[im 51/77]
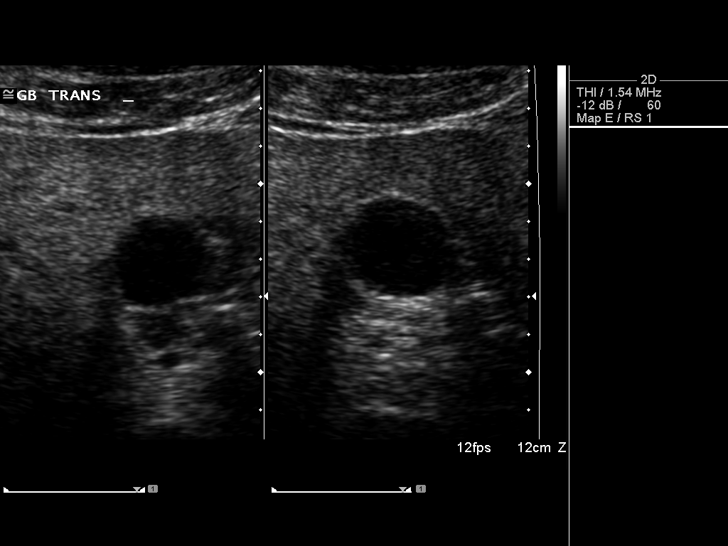
[im 58/77]
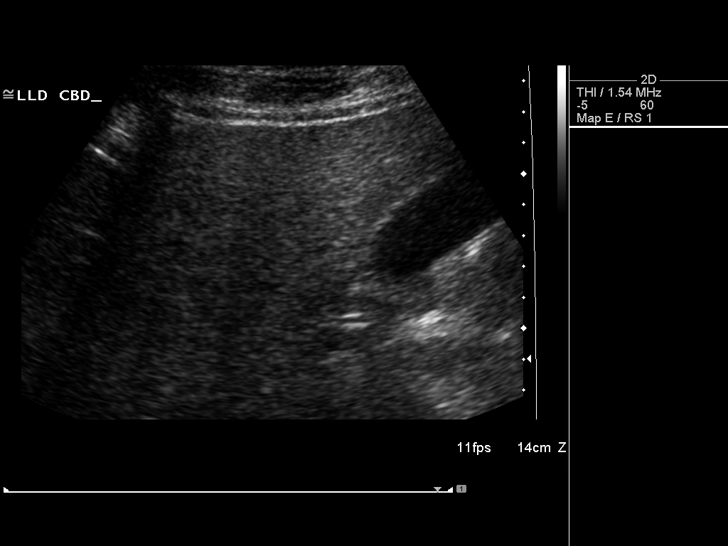
[im 64/77]
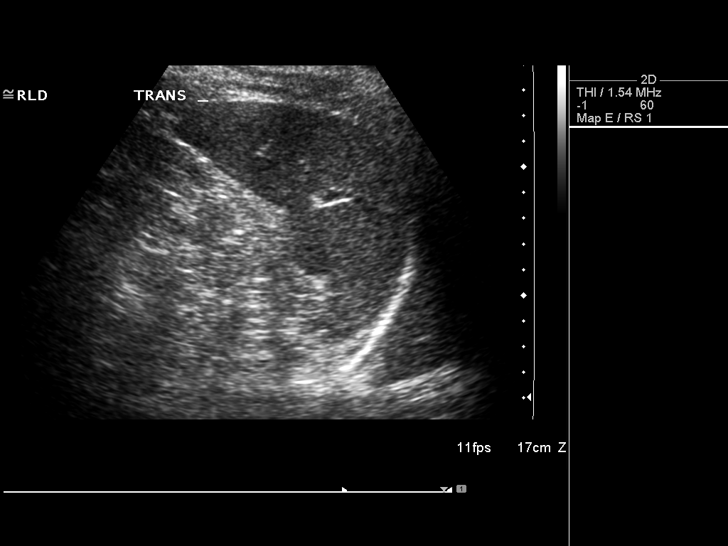
[im 70/77]
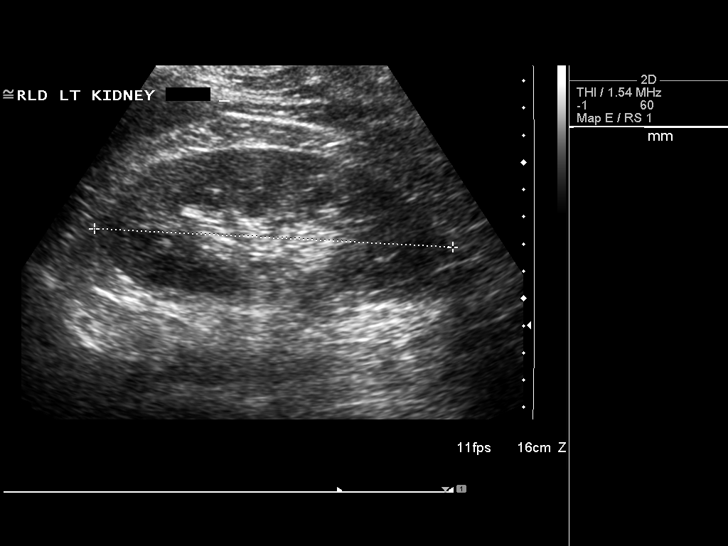
[im 77/77]
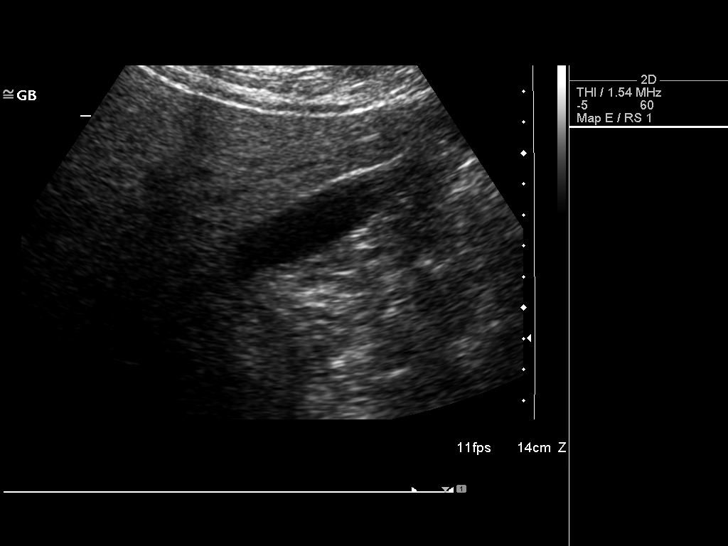

[14 of 25 positions shown; findings below may reference images not displayed]

FINDINGS: Technically suboptimal exam due to large patient habitus.

Gallbladder: No gallstones or wall thickening visualized. No
sonographic Murphy sign noted by sonographer.

Common bile duct: Diameter: 4 mm, within normal limits.

Liver: Diffusely increased echogenicity of the hepatic parenchyma,
consistent with hepatic steatosis/diffuse hepatocellular disease. No
focal mass lesion identified. This appears similar to previous exam.

IVC: No abnormality visualized.

Pancreas: Visualized portion unremarkable.

Spleen: Size and appearance within normal limits.

Right Kidney: Length: 12.7 cm. Echogenicity within normal limits. No
mass or hydronephrosis visualized.

Left Kidney: Length: 13.2 cm. Echogenicity within normal limits. No
mass or hydronephrosis visualized.

Abdominal aorta: No aneurysm visualized.

Other findings: None.
IMPRESSION: Diffuse hepatic steatosis/ hepatocellular disease. No focal liver
mass visualized sonographically.

No evidence of gallstones, biliary dilatation, or other acute
findings.

## 2017-02-21 MED FILL — ATORVASTATIN 40 MG TABLET: 40 | 90 days supply | Qty: 90 | Fill #3

## 2017-06-21 ENCOUNTER — Encounter: Payer: 59 | Admitting: Internal Medicine

## 2017-07-04 ENCOUNTER — Telehealth: Payer: Self-pay

## 2017-07-04 NOTE — Telephone Encounter (Signed)
Pt is due for an appt. Can you call and see if he will schedule.  

## 2017-07-05 NOTE — Telephone Encounter (Signed)
Patients phone numbers where not correct.  I have sent patient a mychart message.

## 2017-08-29 ENCOUNTER — Ambulatory Visit (INDEPENDENT_AMBULATORY_CARE_PROVIDER_SITE_OTHER): Payer: Self-pay

## 2017-08-29 ENCOUNTER — Ambulatory Visit (INDEPENDENT_AMBULATORY_CARE_PROVIDER_SITE_OTHER): Payer: Self-pay | Admitting: Physician Assistant

## 2017-08-29 ENCOUNTER — Encounter: Payer: Self-pay | Admitting: Physician Assistant

## 2017-08-29 VITALS — BP 128/75 | HR 77 | Temp 98.6°F | Resp 18 | Ht 67.84 in | Wt 223.2 lb

## 2017-08-29 DIAGNOSIS — Z111 Encounter for screening for respiratory tuberculosis: Secondary | ICD-10-CM

## 2017-08-29 NOTE — Patient Instructions (Signed)
     IF you received an x-ray today, you will receive an invoice from [facility]. Please contact  Radiology at [phone] with questions or concerns regarding your invoice.   IF you received labwork today, you will receive an invoice from [facility]. Please contact [facility] at [phone] with questions or concerns regarding your invoice.   Our billing staff will not be able to assist you with questions regarding bills from these companies.  You will be contacted with the lab results as soon as they are available. The fastest way to get your results is to activate your My Chart account. Instructions are located on the last page of this paperwork. If you have not heard from us regarding the results in 2 weeks, please contact this office.     

## 2017-08-29 NOTE — Progress Notes (Signed)
  Tuberculosis Risk Questionnaire  1. Yes [place] Were you born outside the [place] in one of the following parts of the world: [place], [place], [place], [place] or [place]?    2. Yes [place] Have you traveled outside the [place] and lived for more than one month in one of the following parts of the world: [place], [place], [place], [place] or [place]?    3. No Do you have a compromised immune system such as from any of the following conditions:HIV/AIDS, organ or bone marrow transplantation, diabetes, immunosuppressive medicines (e.g. Prednisone, Remicaide), leukemia, lymphoma, cancer of the head or neck, gastrectomy or jejunal bypass, end-stage renal disease (on dialysis), or silicosis?     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    5. No Have you ever: injected illegal drugs, used crack cocaine, lived in a homeless shelter  or been in jail or prison?     6. No Have you ever been exposed to anyone with infectious tuberculosis?  7. Yes  Have you ever had a BCG vaccine? (BCG is a vaccine for tuberculosis  (TB) used in OTHER countries, NOT in the [place]).  8. Yes  Have you ever been advised by a health care provider NOT to have a TB skin test?  9. Yes 1997 Have you ever had a POSITIVE TB skin test?  IF SO, when? 1997  IF SO, were you treated with INH? yes  IF SO, where[place]  Tuberculosis Symptom Questionnaire  Do you currently have any of the following symptoms?  1. No Unexplained cough lasting more than 3 weeks?   2. No Unexplained fever lasting more than 3 weeks.   3. No Night Sweats (sweating that leaves the bedclothes and sheets wet)     4. No Shortness of Breath   5. No Chest Pain   6. No Unintentional weight loss    7. No Unexplained fatigue (very tired for no reason)

## 2017-09-02 NOTE — Progress Notes (Signed)
[facility] [address], [place] [phone]  Date:  08/29/2017   Name:  Jerry Stokes   DOB:  10/02/1991   MRN:  [id number]  PCP:  Corwin LevinsJohn, James W, MD    History of Present Illness:  Jerry Stokes is a 26 y.o. male patient who presents to PCP with cc of need for tb screening He had a tb skin test in the past that was positive.  Though had bcg vaccination, he was treated prophylactic He denies any fatigue, night sweats, cough, or dyspnea.  No abnormal fever.     Patient Active Problem List   Diagnosis Date Noted  . 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 Abnormal liver function test 12/24/2013  . 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 Diabetes (HCC) 12/12/2011  . Preventative health care 12/09/2011  . Hyperlipidemia 12/01/2010  . Depressive disorder, not elsewhere classified 06/15/2008  . Essential hypertension 01/13/2008    Past Medical History:  Diagnosis Date  . Alpha thalassemia trait    several in family, as well as mother  . Depression   . Diabetes mellitus without complication (HCC)   . Fistula    Pelvic  . Hyperlipidemia   . Hypertension   . Impaired glucose tolerance 12/12/2011    Past Surgical History:  Procedure Laterality Date  . CYSTECTOMY  1997   Under tongue  . EXPLORATORY LAPAROTOMY  2001   Suprapubic fistula repair age 89    Social History  Substance Use Topics  . Smoking status: Never Smoker  . Smokeless tobacco: Never Used  . Alcohol use No    Family History  Problem Relation Age of Onset  . Diabetes Father   . Hyperlipidemia Father   . Hypertension Other   . Stroke Other   . Hypertension Mother   . Diabetes Brother     Allergies  Allergen Reactions  . Enalapril Maleate     REACTION: cough    Medication list has been reviewed and updated.  Current Outpatient Prescriptions on File Prior to Visit  Medication Sig Dispense Refill  . atorvastatin (LIPITOR) 40 MG tablet Take 1 tablet (40 mg total) by mouth daily.  90 tablet 3  . losartan-hydrochlorothiazide (HYZAAR) 100-12.5 MG tablet Take 1 tablet by mouth daily. 90 tablet 3  . metFORMIN (GLUCOPHAGE-XR) 500 MG 24 hr tablet 2 tabs by mouth per day 180 tablet 3   No current facility-administered medications on file prior to visit.     ROS ROS otherwise unremarkable unless listed above.  Physical Examination: BP 128/75 (BP Location: Right Arm, Patient Position: Sitting, Cuff Size: Large)   Pulse 77   Temp 98.6 F (37 C) (Oral)   Resp 18   Ht 5' 7.84" (1.723 m)   Wt 223 lb 3.2 oz (101.2 kg)   SpO2 97%   BMI 34.10 kg/m  Ideal Body Weight: Weight in (lb) to have BMI = 25: 163.3  Physical Exam  Constitutional: He is oriented to person, place, and time. He appears well-developed and well-nourished. No distress.  HENT:  Head: Normocephalic and atraumatic.  Eyes: Pupils are equal, round, and reactive to light. Conjunctivae and EOM are normal.  Cardiovascular: Normal rate.   Pulmonary/Chest: Effort normal. No accessory muscle usage. No apnea. No respiratory distress. He has no decreased breath sounds. He has no wheezes. He has no rhonchi.  Neurological: He is alert and oriented to person, place, and time.  Skin: Skin is warm and dry. He is not diaphoretic.  Psychiatric: He has a normal mood and affect. His behavior is normal.    Dg Chest 1 View  Result Date: 08/29/2017 CLINICAL DATA:  Screening for pulmonary tuberculosis. EXAM: CHEST 1 VIEW COMPARISON:  12/12/2011 FINDINGS: The heart size and mediastinal contours are within normal limits. Both lungs are clear. The visualized skeletal structures are unremarkable. IMPRESSION: No active disease. Electronically Signed   By: Marlan Palau M.D.   On: 08/29/2017 17:28    Assessment and Plan: Jerry Stokes is a 26 y.o. male who is here today for tb screening. Very low likelihood of pulmonary tb. 1. Screening examination for pulmonary tuberculosis - DG Chest 1 View; Future   Trena Platt,  PA-C [facility] 09/02/2017 7:49 PM

## 2018-04-03 ENCOUNTER — Encounter: Payer: Self-pay | Admitting: Physician Assistant

## 2018-12-26 DIAGNOSIS — E669 Obesity, unspecified: Secondary | ICD-10-CM | POA: Insufficient documentation

## 2019-12-17 ENCOUNTER — Ambulatory Visit: Payer: PRIVATE HEALTH INSURANCE | Attending: Internal Medicine

## 2019-12-17 DIAGNOSIS — Z20822 Contact with and (suspected) exposure to covid-19: Secondary | ICD-10-CM

## 2019-12-18 LAB — NOVEL CORONAVIRUS, NAA: SARS-CoV-2, NAA: NOT DETECTED

## 2020-12-31 ENCOUNTER — Ambulatory Visit: Payer: PRIVATE HEALTH INSURANCE | Attending: Internal Medicine

## 2020-12-31 ENCOUNTER — Other Ambulatory Visit (HOSPITAL_BASED_OUTPATIENT_CLINIC_OR_DEPARTMENT_OTHER): Payer: Self-pay | Admitting: Internal Medicine

## 2020-12-31 DIAGNOSIS — Z23 Encounter for immunization: Secondary | ICD-10-CM

## 2020-12-31 NOTE — Progress Notes (Signed)
   Covid-19 Vaccination Clinic  Name:  JOSEY FORCIER    MRN: [id number] DOB: [birth year]  12/31/2020  Mr. Brodrick was observed post Covid-19 immunization for 15 minutes without incident. He was provided with Vaccine Information Sheet and instruction to access the V-Safe system.   Mr. Kracht was instructed to call 911 with any severe reactions post vaccine: Marland Kitchen Difficulty breathing  . Swelling of face and throat  . A fast heartbeat  . A bad rash all over body  . Dizziness and weakness   Immunizations Administered    Name Date Dose VIS Date Route   Pfizer COVID-19 Vaccine 12/31/2020  9:23 AM 0.3 mL 10/13/2020 Intramuscular   Manufacturer: [employer], [employer]   Lot: [id number]   NDC: [address]-9

## 2021-01-03 MED FILL — PFIZER-BIONTECH COVID-19 VA: 30 | 21 days supply | Qty: 0 | Fill #0

## 2022-09-20 ENCOUNTER — Other Ambulatory Visit (HOSPITAL_COMMUNITY): Payer: Self-pay

## 2022-11-27 ENCOUNTER — Other Ambulatory Visit (HOSPITAL_COMMUNITY): Payer: Self-pay

## 2022-11-27 ENCOUNTER — Other Ambulatory Visit: Payer: Self-pay | Admitting: Internal Medicine

## 2022-11-27 ENCOUNTER — Ambulatory Visit (INDEPENDENT_AMBULATORY_CARE_PROVIDER_SITE_OTHER): Payer: PRIVATE HEALTH INSURANCE | Admitting: Internal Medicine

## 2022-11-27 VITALS — BP 128/72 | HR 84 | Temp 98.1°F | Ht 67.84 in | Wt 230.0 lb

## 2022-11-27 DIAGNOSIS — E538 Deficiency of other specified B group vitamins: Secondary | ICD-10-CM

## 2022-11-27 DIAGNOSIS — E119 Type 2 diabetes mellitus without complications: Secondary | ICD-10-CM

## 2022-11-27 DIAGNOSIS — I10 Essential (primary) hypertension: Secondary | ICD-10-CM

## 2022-11-27 DIAGNOSIS — Z0001 Encounter for general adult medical examination with abnormal findings: Secondary | ICD-10-CM

## 2022-11-27 DIAGNOSIS — E559 Vitamin D deficiency, unspecified: Secondary | ICD-10-CM | POA: Diagnosis not present

## 2022-11-27 DIAGNOSIS — E7800 Pure hypercholesterolemia, unspecified: Secondary | ICD-10-CM | POA: Diagnosis not present

## 2022-11-27 LAB — BASIC METABOLIC PANEL
BUN: 13 mg/dL (ref 6–23)
CO2: 27 mEq/L (ref 19–32)
Calcium: 9.7 mg/dL (ref 8.4–10.5)
Chloride: 98 mEq/L (ref 96–112)
Creatinine, Ser: 1.01 mg/dL (ref 0.40–1.50)
GFR: 99.16 mL/min (ref >60.00)
Glucose, Bld: 216 mg/dL — ABNORMAL HIGH (ref 70–99)
Potassium: 3.9 mEq/L (ref 3.5–5.1)
Sodium: 135 mEq/L (ref 135–145)

## 2022-11-27 LAB — LIPID PANEL
Cholesterol: 291 mg/dL — ABNORMAL HIGH (ref 0–200)
HDL: 42.7 mg/dL (ref >39.00)
Total CHOL/HDL Ratio: 7
Triglycerides: 838 mg/dL — ABNORMAL HIGH (ref 0.0–149.0)

## 2022-11-27 LAB — URINALYSIS, ROUTINE W REFLEX MICROSCOPIC
Bilirubin Urine: NEGATIVE
Hgb urine dipstick: NEGATIVE
Leukocytes,Ua: NEGATIVE
Nitrite: NEGATIVE
RBC / HPF: NONE SEEN (ref 0–?)
Specific Gravity, Urine: 1.025 (ref 1.000–1.030)
Total Protein, Urine: NEGATIVE
Urine Glucose: 500 — AB
Urobilinogen, UA: 0.2 (ref 0.0–1.0)
WBC, UA: NONE SEEN (ref 0–?)
pH: 6 (ref 5.0–8.0)

## 2022-11-27 LAB — CBC WITH DIFFERENTIAL/PLATELET
Basophils Absolute: 0.1 10*3/uL (ref 0.0–0.1)
Basophils Relative: 1.2 % (ref 0.0–3.0)
Eosinophils Absolute: 0.2 10*3/uL (ref 0.0–0.7)
Eosinophils Relative: 2.4 % (ref 0.0–5.0)
HCT: 48.8 % (ref 39.0–52.0)
Hemoglobin: 15.7 g/dL (ref 13.0–17.0)
Lymphocytes Relative: 37.2 % (ref 12.0–46.0)
Lymphs Abs: 2.4 10*3/uL (ref 0.7–4.0)
MCHC: 32.1 g/dL (ref 30.0–36.0)
MCV: 73.9 fl — ABNORMAL LOW (ref 78.0–100.0)
Monocytes Absolute: 0.4 10*3/uL (ref 0.1–1.0)
Monocytes Relative: 6.9 % (ref 3.0–12.0)
Neutro Abs: 3.4 10*3/uL (ref 1.4–7.7)
Neutrophils Relative %: 52.3 % (ref 43.0–77.0)
Platelets: 246 10*3/uL (ref 150.0–400.0)
RBC: 6.6 Mil/uL — ABNORMAL HIGH (ref 4.22–5.81)
RDW: 13.7 % (ref 11.5–15.5)
WBC: 6.5 10*3/uL (ref 4.0–10.5)

## 2022-11-27 LAB — HEPATIC FUNCTION PANEL
ALT: 68 U/L — ABNORMAL HIGH (ref 0–53)
AST: 33 U/L (ref 0–37)
Albumin: 4.8 g/dL (ref 3.5–5.2)
Alkaline Phosphatase: 85 U/L (ref 39–117)
Bilirubin, Direct: 0.1 mg/dL (ref 0.0–0.3)
Total Bilirubin: 0.5 mg/dL (ref 0.2–1.2)
Total Protein: 8 g/dL (ref 6.0–8.3)

## 2022-11-27 LAB — TSH: TSH: 1.2 u[IU]/mL (ref 0.35–5.50)

## 2022-11-27 LAB — HEMOGLOBIN A1C: Hgb A1c MFr Bld: 8.4 % — ABNORMAL HIGH (ref 4.6–6.5)

## 2022-11-27 LAB — MICROALBUMIN / CREATININE URINE RATIO
Creatinine,U: 152 mg/dL
Microalb Creat Ratio: 1.3 mg/g (ref 0.0–30.0)
Microalb, Ur: 2 mg/dL — ABNORMAL HIGH (ref 0.0–1.9)

## 2022-11-27 LAB — VITAMIN B12: Vitamin B-12: 236 pg/mL (ref 211–911)

## 2022-11-27 LAB — VITAMIN D 25 HYDROXY (VIT D DEFICIENCY, FRACTURES): VITD: 14.73 ng/mL — ABNORMAL LOW (ref 30.00–100.00)

## 2022-11-27 LAB — LDL CHOLESTEROL, DIRECT: Direct LDL: 113 mg/dL

## 2022-11-27 MED ORDER — METFORMIN HCL ER 500 MG PO TB24
1000.0000 mg | ORAL_TABLET | Freq: Every day | ORAL | 3 refills | Status: DC
  Filled 2022-11-27: qty 180, 90d supply, fill #0

## 2022-11-27 MED ORDER — ROSUVASTATIN CALCIUM 10 MG PO TABS
10.0000 mg | ORAL_TABLET | Freq: Every day | ORAL | 3 refills | Status: DC
  Filled 2022-11-27: qty 90, 90d supply, fill #0

## 2022-11-27 NOTE — Progress Notes (Unsigned)
Chief Complaint:: wellness exam and DM, htn, hld, depression, last seen here 2017       HPI:  Jerry Stokes is a 31 y.o. male here for wellness exam; declines covid booster, hiv screen o/w up to date                        Also BP much improved with more physcial job, works out at gym once per wk, and better diet.  BP has been lower and never started medication last visit, to work on lifestyle.  Has had mild worsening depressive symptoms, but no suicidal ideation, or panic; this is chronic mild persistent but declines need for tx at this time or counseling.  Pt denies chest pain, increased sob or doe, wheezing, orthopnea, PND, increased LE swelling, palpitations, dizziness or syncope.   Pt denies polydipsia, polyuria, or new focal neuro s/s.     Wt Readings from Last 3 Encounters:  11/27/22 230 lb (104.3 kg)  08/29/17 223 lb 3.2 oz (101.2 kg)  03/23/16 235 lb (106.6 kg)   BP Readings from Last 3 Encounters:  11/27/22 128/72  08/29/17 128/75  03/23/16 140/90   Immunization History  Administered Date(s) Administered   Influenza Split 12/12/2011   Influenza Whole 11/30/2009, 12/01/2010   Influenza, Seasonal, Injecte, Preservative Fre 12/17/2012   Influenza,inj,Quad PF,6+ Mos 10/09/2013, 10/29/2014, 10/29/2014, 10/26/2015   Influenza-Unspecified 12/12/2011, 12/17/2012, 10/09/2013, 10/29/2014, 10/26/2015, 10/09/2022   PFIZER(Purple Top)SARS-COV-2 Vaccination 12/31/2020   Pneumococcal Conjugate-13 03/23/2016   Td 12/25/2002, 11/30/2009   Td (Adult),5 Lf Tetanus Toxid, Preservative Free 12/25/2002, 11/30/2009   Tdap 05/14/2019   There are no preventive care reminders to display for this patient.     Past Medical History:  Diagnosis Date   Alpha thalassemia trait    several in family, as well as mother   Depression    Diabetes mellitus without complication (HCC)    Fistula    Pelvic   Hyperlipidemia    Hypertension    Impaired glucose tolerance 12/12/2011   Past  Surgical History:  Procedure Laterality Date   CYSTECTOMY  1997   Under tongue   EXPLORATORY LAPAROTOMY  2001   Suprapubic fistula repair age 47    reports that he has never smoked. He has never used smokeless tobacco. He reports that he does not drink alcohol and does not use drugs. family history includes Diabetes in his brother and father; Hyperlipidemia in his father; Hypertension in his mother and another family member; Stroke in an other family member. Allergies  Allergen Reactions   Enalapril Maleate     REACTION: cough   No current outpatient medications on file prior to visit.   No current facility-administered medications on file prior to visit.        ROS:  All others reviewed and negative.  Objective        PE:  BP 128/72 (BP Location: Left Arm, Patient Position: Sitting, Cuff Size: Large)   Pulse 84   Temp 98.1 F (36.7 C) (Oral)   Ht 5' 7.84" (1.723 m)   Wt 230 lb (104.3 kg)   SpO2 98%   BMI 35.14 kg/m                 Constitutional: Pt appears in NAD, obese               HENT: Head: NCAT.  Right Ear: External ear normal.                 Left Ear: External ear normal.                Eyes: . Pupils are equal, round, and reactive to light. Conjunctivae and EOM are normal               Nose: without d/c or deformity               Neck: Neck supple. Gross normal ROM               Cardiovascular: Normal rate and regular rhythm.                 Pulmonary/Chest: Effort normal and breath sounds without rales or wheezing.                Abd:  Soft, NT, ND, + BS, no organomegaly               Neurological: Pt is alert. At baseline orientation, motor grossly intact               Skin: Skin is warm. No rashes, no other new lesions, LE edema - none               Psychiatric: Pt behavior is normal without agitation   Micro: none  Cardiac tracings I have personally interpreted today:  none  Pertinent Radiological findings (summarize): none   Lab Results   Component Value Date   WBC 6.5 11/27/2022   HGB 15.7 11/27/2022   HCT 48.8 11/27/2022   PLT 246.0 11/27/2022   GLUCOSE 216 (H) 11/27/2022   CHOL 291 (H) 11/27/2022   TRIG (H) 11/27/2022    838.0 Triglyceride is over 400; calculations on Lipids are invalid.   HDL 42.70 11/27/2022   LDLDIRECT 113.0 11/27/2022   ALT 68 (H) 11/27/2022   AST 33 11/27/2022   NA 135 11/27/2022   K 3.9 11/27/2022   CL 98 11/27/2022   CREATININE 1.01 11/27/2022   BUN 13 11/27/2022   CO2 27 11/27/2022   TSH 1.20 11/27/2022   HGBA1C 8.4 (H) 11/27/2022   MICROALBUR 2.0 (H) 11/27/2022   Assessment/Plan:  Jerry Stokes is a 31 y.o. Asian [4] male with  has a past medical history of Alpha thalassemia trait, Depression, Diabetes mellitus without complication (HCC), Fistula, Hyperlipidemia, Hypertension, and Impaired glucose tolerance (12/12/2011).  Encounter for well adult exam with abnormal findings Age and sex appropriate education and counseling updated with regular exercise and diet Referrals for preventative services - declines hiv screen Immunizations addressed - declines covid booster Smoking counseling  - none needed Evidence for depression or other mood disorder - stable chronic mild depression, declines tx Most recent labs reviewed. I have personally reviewed and have noted: 1) the patient's medical and social history 2) The patient's current medications and supplements 3) The patient's height, weight, and BMI have been recorded in the chart   Diabetes [facility]) Lab Results  Component Value Date   HGBA1C 8.4 (H) 11/27/2022   Worsening uncontrolled pt to start metformin ER 500 mg - 2 qd   Essential hypertension BP Readings from Last 3 Encounters:  11/27/22 128/72  08/29/17 128/75  03/23/16 140/90   Stable, pt to continue medical treatment  - diet, wt control, excercise  Hyperlipidemia Lab Results  Component Value Date   CHOL 291 (H) 11/27/2022   HDL 42.70 11/27/2022  LDLDIRECT  113.0 11/27/2022   TRIG (H) 11/27/2022    838.0 Triglyceride is over 400; calculations on Lipids are invalid.   CHOLHDL 7 11/27/2022   Uncontrolled, goal ldl < 70, for crestor 20 mg qd  Followup: Return in about 6 months (around 05/29/2023).  Oliver Barre, MD 11/28/2022 8:00 PM [facility]

## 2022-11-27 NOTE — Patient Instructions (Signed)

## 2022-11-28 ENCOUNTER — Encounter: Payer: Self-pay | Admitting: Internal Medicine

## 2022-11-28 NOTE — Assessment & Plan Note (Signed)
BP Readings from Last 3 Encounters:  11/27/22 128/72  08/29/17 128/75  03/23/16 140/90   Stable, pt to continue medical treatment  - diet, wt control, excercise

## 2022-11-28 NOTE — Assessment & Plan Note (Signed)
Lab Results  Component Value Date   HGBA1C 8.4 (H) 11/27/2022   Worsening uncontrolled pt to start metformin ER 500 mg - 2 qd

## 2022-11-28 NOTE — Assessment & Plan Note (Signed)
Age and sex appropriate education and counseling updated with regular exercise and diet Referrals for preventative services - declines hiv screen Immunizations addressed - declines covid booster Smoking counseling  - none needed Evidence for depression or other mood disorder - stable chronic mild depression, declines tx Most recent labs reviewed. I have personally reviewed and have noted: 1) the patient's medical and social history 2) The patient's current medications and supplements 3) The patient's height, weight, and BMI have been recorded in the chart

## 2022-11-28 NOTE — Assessment & Plan Note (Signed)
Lab Results  Component Value Date   CHOL 291 (H) 11/27/2022   HDL 42.70 11/27/2022   LDLDIRECT 113.0 11/27/2022   TRIG (H) 11/27/2022    838.0 Triglyceride is over 400; calculations on Lipids are invalid.   CHOLHDL 7 11/27/2022   Uncontrolled, goal ldl < 70, for crestor 20 mg qd

## 2022-12-09 ENCOUNTER — Other Ambulatory Visit (HOSPITAL_COMMUNITY): Payer: Self-pay

## 2022-12-14 ENCOUNTER — Other Ambulatory Visit (HOSPITAL_COMMUNITY): Payer: Self-pay

## 2023-11-29 ENCOUNTER — Encounter: Payer: PRIVATE HEALTH INSURANCE | Admitting: Internal Medicine

## 2023-12-06 ENCOUNTER — Ambulatory Visit (INDEPENDENT_AMBULATORY_CARE_PROVIDER_SITE_OTHER): Payer: 59 | Admitting: Internal Medicine

## 2023-12-06 ENCOUNTER — Other Ambulatory Visit: Payer: Self-pay | Admitting: Internal Medicine

## 2023-12-06 ENCOUNTER — Other Ambulatory Visit: Payer: Self-pay

## 2023-12-06 ENCOUNTER — Other Ambulatory Visit (HOSPITAL_COMMUNITY): Payer: Self-pay

## 2023-12-06 ENCOUNTER — Encounter: Payer: Self-pay | Admitting: Internal Medicine

## 2023-12-06 VITALS — BP 128/82 | HR 85 | Temp 99.2°F | Ht 67.0 in | Wt 227.0 lb

## 2023-12-06 DIAGNOSIS — I10 Essential (primary) hypertension: Secondary | ICD-10-CM

## 2023-12-06 DIAGNOSIS — E7800 Pure hypercholesterolemia, unspecified: Secondary | ICD-10-CM | POA: Diagnosis not present

## 2023-12-06 DIAGNOSIS — Z114 Encounter for screening for human immunodeficiency virus [HIV]: Secondary | ICD-10-CM

## 2023-12-06 DIAGNOSIS — E119 Type 2 diabetes mellitus without complications: Secondary | ICD-10-CM

## 2023-12-06 DIAGNOSIS — Z0001 Encounter for general adult medical examination with abnormal findings: Secondary | ICD-10-CM | POA: Diagnosis not present

## 2023-12-06 DIAGNOSIS — F32A Depression, unspecified: Secondary | ICD-10-CM | POA: Diagnosis not present

## 2023-12-06 DIAGNOSIS — Z7984 Long term (current) use of oral hypoglycemic drugs: Secondary | ICD-10-CM

## 2023-12-06 LAB — HEPATIC FUNCTION PANEL
ALT: 66 U/L — ABNORMAL HIGH (ref 0–53)
AST: 37 U/L (ref 0–37)
Albumin: 4.8 g/dL (ref 3.5–5.2)
Alkaline Phosphatase: 90 U/L (ref 39–117)
Bilirubin, Direct: 0.1 mg/dL (ref 0.0–0.3)
Total Bilirubin: 0.7 mg/dL (ref 0.2–1.2)
Total Protein: 8.1 g/dL (ref 6.0–8.3)

## 2023-12-06 LAB — CBC WITH DIFFERENTIAL/PLATELET
Basophils Absolute: 0.1 10*3/uL (ref 0.0–0.1)
Basophils Relative: 1 % (ref 0.0–3.0)
Eosinophils Absolute: 0.1 10*3/uL (ref 0.0–0.7)
Eosinophils Relative: 1.8 % (ref 0.0–5.0)
HCT: 51.4 % (ref 39.0–52.0)
Hemoglobin: 16.3 g/dL (ref 13.0–17.0)
Lymphocytes Relative: 39.8 % (ref 12.0–46.0)
Lymphs Abs: 3 10*3/uL (ref 0.7–4.0)
MCHC: 31.7 g/dL (ref 30.0–36.0)
MCV: 74.1 fL — ABNORMAL LOW (ref 78.0–100.0)
Monocytes Absolute: 0.5 10*3/uL (ref 0.1–1.0)
Monocytes Relative: 7.2 % (ref 3.0–12.0)
Neutro Abs: 3.7 10*3/uL (ref 1.4–7.7)
Neutrophils Relative %: 50.2 % (ref 43.0–77.0)
Platelets: 245 10*3/uL (ref 150.0–400.0)
RBC: 6.94 Mil/uL — ABNORMAL HIGH (ref 4.22–5.81)
RDW: 14 % (ref 11.5–15.5)
WBC: 7.5 10*3/uL (ref 4.0–10.5)

## 2023-12-06 LAB — BASIC METABOLIC PANEL
BUN: 12 mg/dL (ref 6–23)
CO2: 26 meq/L (ref 19–32)
Calcium: 9.7 mg/dL (ref 8.4–10.5)
Chloride: 97 meq/L (ref 96–112)
Creatinine, Ser: 1.06 mg/dL (ref 0.40–1.50)
GFR: 92.9 mL/min (ref >60.00)
Glucose, Bld: 215 mg/dL — ABNORMAL HIGH (ref 70–99)
Potassium: 3.9 meq/L (ref 3.5–5.1)
Sodium: 135 meq/L (ref 135–145)

## 2023-12-06 LAB — URINALYSIS, ROUTINE W REFLEX MICROSCOPIC
Bilirubin Urine: NEGATIVE
Hgb urine dipstick: NEGATIVE
Ketones, ur: NEGATIVE
Leukocytes,Ua: NEGATIVE
Nitrite: NEGATIVE
RBC / HPF: NONE SEEN (ref 0–?)
Specific Gravity, Urine: >=1.03 — AB (ref 1.000–1.030)
Total Protein, Urine: 30 — AB
Urine Glucose: 500 — AB
Urobilinogen, UA: 0.2 (ref 0.0–1.0)
pH: 6 (ref 5.0–8.0)

## 2023-12-06 LAB — LIPID PANEL
Cholesterol: 320 mg/dL — ABNORMAL HIGH (ref 0–200)
HDL: 43.4 mg/dL (ref >39.00)
Total CHOL/HDL Ratio: 7
Triglycerides: 855 mg/dL — ABNORMAL HIGH (ref 0.0–149.0)

## 2023-12-06 LAB — TSH: TSH: 2.24 u[IU]/mL (ref 0.35–5.50)

## 2023-12-06 LAB — MICROALBUMIN / CREATININE URINE RATIO
Creatinine,U: 349.7 mg/dL
Microalb Creat Ratio: 4.4 mg/g (ref 0.0–30.0)
Microalb, Ur: 15.5 mg/dL — ABNORMAL HIGH (ref 0.0–1.9)

## 2023-12-06 LAB — HEMOGLOBIN A1C: Hgb A1c MFr Bld: 9.4 % — ABNORMAL HIGH (ref 4.6–6.5)

## 2023-12-06 LAB — LDL CHOLESTEROL, DIRECT: Direct LDL: 116 mg/dL

## 2023-12-06 MED ORDER — ROSUVASTATIN CALCIUM 10 MG PO TABS
10.0000 mg | ORAL_TABLET | Freq: Every day | ORAL | 3 refills | Status: DC
  Filled 2023-12-06: qty 90, 90d supply, fill #0

## 2023-12-06 MED ORDER — EMPAGLIFLOZIN 25 MG PO TABS
25.0000 mg | ORAL_TABLET | Freq: Every day | ORAL | 3 refills | Status: AC
  Filled 2023-12-06: qty 30, 30d supply, fill #0

## 2023-12-06 MED ORDER — FENOFIBRATE 160 MG PO TABS
160.0000 mg | ORAL_TABLET | Freq: Every day | ORAL | 3 refills | Status: AC
  Filled 2023-12-06: qty 90, 90d supply, fill #0

## 2023-12-06 MED ORDER — METFORMIN HCL ER 500 MG PO TB24
1000.0000 mg | ORAL_TABLET | Freq: Every day | ORAL | 3 refills | Status: DC
  Filled 2023-12-06: qty 180, 90d supply, fill #0

## 2023-12-06 MED ORDER — ROSUVASTATIN CALCIUM 20 MG PO TABS
20.0000 mg | ORAL_TABLET | Freq: Every day | ORAL | 3 refills | Status: AC
  Filled 2023-12-06: qty 90, 90d supply, fill #0

## 2023-12-06 MED ORDER — METFORMIN HCL ER 500 MG PO TB24
2000.0000 mg | ORAL_TABLET | Freq: Every day | ORAL | 3 refills | Status: AC
  Filled 2023-12-06: qty 360, 90d supply, fill #0

## 2023-12-06 NOTE — Patient Instructions (Signed)

## 2023-12-06 NOTE — Progress Notes (Signed)
Patient ID: Jerry Stokes, male   DOB: 1991-10-17, 32 y.o.   MRN: [id number]         Chief Complaint:: wellness exam and htn, hld, DM, depression       HPI:  Jerry Stokes is a 32 y.o. male here for wellness exam; plans to call for eye exam soon.  Declines covid booster, o/w up to date                        Also lost wt with better diet.  Pt denies chest pain, increased sob or doe, wheezing, orthopnea, PND, increased LE swelling, palpitations, dizziness or syncope.   Pt denies polydipsia, polyuria, or new focal neuro s/s.    Pt denies fever, wt loss, night sweats, loss of appetite, or other constitutional symptoms  Pt appears to have spotty compliance with meds.  Denies worsening depressive symptoms, suicidal ideation, or panic;    Wt Readings from Last 3 Encounters:  12/06/23 227 lb (103 kg)  11/27/22 230 lb (104.3 kg)  08/29/17 223 lb 3.2 oz (101.2 kg)   BP Readings from Last 3 Encounters:  12/06/23 128/82  11/27/22 128/72  08/29/17 128/75   Immunization History  Administered Date(s) Administered   Influenza Split 12/12/2011   Influenza Whole 11/30/2009, 12/01/2010   Influenza, Seasonal, Injecte, Preservative Fre 12/17/2012   Influenza,inj,Quad PF,6+ Mos 10/09/2013, 10/29/2014, 10/29/2014, 10/26/2015, 10/14/2023   Influenza-Unspecified 12/12/2011, 12/17/2012, 10/09/2013, 10/29/2014, 10/26/2015, 10/09/2022   PFIZER(Purple Top)SARS-COV-2 Vaccination 12/31/2020   Pneumococcal Conjugate-13 03/23/2016   Td 12/25/2002, 11/30/2009   Td (Adult),5 Lf Tetanus Toxid, Preservative Free 12/25/2002, 11/30/2009   Tdap 05/14/2019   Health Maintenance Due  Topic Date Due   OPHTHALMOLOGY EXAM  12/05/2022   COVID-19 Vaccine (2 - 2024-25 season) 08/26/2023      Past Medical History:  Diagnosis Date   Alpha thalassemia trait    several in family, as well as mother   Depression    Diabetes mellitus without complication (HCC)    Fistula    Pelvic   Hyperlipidemia    Hypertension     Impaired glucose tolerance 12/12/2011   Past Surgical History:  Procedure Laterality Date   CYSTECTOMY  1997   Under tongue   EXPLORATORY LAPAROTOMY  2001   Suprapubic fistula repair age 59    reports that he has never smoked. He has never used smokeless tobacco. He reports that he does not drink alcohol and does not use drugs. family history includes Diabetes in his brother and father; Hyperlipidemia in his father; Hypertension in his mother and another family member; Stroke in an other family member. Allergies  Allergen Reactions   Enalapril Maleate     REACTION: cough   No current outpatient medications on file prior to visit.   No current facility-administered medications on file prior to visit.        ROS:  All others reviewed and negative.  Objective        PE:  BP 128/82 (BP Location: Right Arm, Patient Position: Sitting, Cuff Size: Normal)   Pulse 85   Temp 99.2 F (37.3 C) (Oral)   Ht 5\' 7"  (1.702 m)   Wt 227 lb (103 kg)   SpO2 98%   BMI 35.55 kg/m                 Constitutional: Pt appears in NAD               HENT: Head:  NCAT.                Right Ear: External ear normal.                 Left Ear: External ear normal.                Eyes: . Pupils are equal, round, and reactive to light. Conjunctivae and EOM are normal               Nose: without d/c or deformity               Neck: Neck supple. Gross normal ROM               Cardiovascular: Normal rate and regular rhythm.                 Pulmonary/Chest: Effort normal and breath sounds without rales or wheezing.                Abd:  Soft, NT, ND, + BS, no organomegaly               Neurological: Pt is alert. At baseline orientation, motor grossly intact               Skin: Skin is warm. No rashes, no other new lesions, LE edema - none               Psychiatric: Pt behavior is normal without agitation   Micro: none  Cardiac tracings I have personally interpreted today:  none  Pertinent Radiological  findings (summarize): none   Lab Results  Component Value Date   WBC 7.5 12/06/2023   HGB 16.3 12/06/2023   HCT 51.4 12/06/2023   PLT 245.0 12/06/2023   GLUCOSE 215 (H) 12/06/2023   CHOL 320 (H) 12/06/2023   TRIG (H) 12/06/2023    855.0 Triglyceride is over 400; calculations on Lipids are invalid.   HDL 43.40 12/06/2023   LDLDIRECT 116.0 12/06/2023   ALT 66 (H) 12/06/2023   AST 37 12/06/2023   NA 135 12/06/2023   K 3.9 12/06/2023   CL 97 12/06/2023   CREATININE 1.06 12/06/2023   BUN 12 12/06/2023   CO2 26 12/06/2023   TSH 2.24 12/06/2023   HGBA1C 9.4 (H) 12/06/2023   MICROALBUR 15.5 (H) 12/06/2023   Assessment/Plan:  Jerry Stokes is a 32 y.o. Asian [4] male with  has a past medical history of Alpha thalassemia trait, Depression, Diabetes mellitus without complication (HCC), Fistula, Hyperlipidemia, Hypertension, and Impaired glucose tolerance (12/12/2011).  Encounter for well adult exam with abnormal findings Age and sex appropriate education and counseling updated with regular exercise and diet Referrals for preventative services - for eye exam soon Immunizations addressed - declines covid booster,  Smoking counseling  - none needed Evidence for depression or other mood disorder - none significant Most recent labs reviewed. I have personally reviewed and have noted: 1) the patient's medical and social history 2) The patient's current medications and supplements 3) The patient's height, weight, and BMI have been recorded in the chart   Hyperlipidemia Lab Results  Component Value Date   CHOL 320 (H) 12/06/2023   HDL 43.40 12/06/2023   LDLDIRECT 116.0 12/06/2023   TRIG (H) 12/06/2023    855.0 Triglyceride is over 400; calculations on Lipids are invalid.   CHOLHDL 7 12/06/2023   uncontrolled, pt to add crestor 20 mg every day, and fenofibrate 160 mg per day   Essential  hypertension BP Readings from Last 3 Encounters:  12/06/23 128/82  11/27/22 128/72   08/29/17 128/75   Stable, pt to continue medical treatment  - diet, wt control  Diabetes (HCC) Lab Results  Component Value Date   HGBA1C 9.4 (H) 12/06/2023   Uncontrolled, for start jardianc3 25 every day, start metformin ER 500 mg- 4 every day, refer DM education   Depression Stable overall, declines need for SSRI or referral for now  Followup: Return in about 6 months (around 06/05/2024).  Oliver Barre, MD 12/09/2023 7:10 PM [facility]

## 2023-12-07 ENCOUNTER — Other Ambulatory Visit (HOSPITAL_COMMUNITY): Payer: Self-pay

## 2023-12-07 ENCOUNTER — Other Ambulatory Visit: Payer: Self-pay

## 2023-12-07 LAB — HIV ANTIBODY (ROUTINE TESTING W REFLEX): HIV 1&2 Ab, 4th Generation: NONREACTIVE

## 2023-12-09 ENCOUNTER — Encounter: Payer: Self-pay | Admitting: Internal Medicine

## 2023-12-09 NOTE — Assessment & Plan Note (Signed)
Lab Results  Component Value Date   CHOL 320 (H) 12/06/2023   HDL 43.40 12/06/2023   LDLDIRECT 116.0 12/06/2023   TRIG (H) 12/06/2023    855.0 Triglyceride is over 400; calculations on Lipids are invalid.   CHOLHDL 7 12/06/2023   uncontrolled, pt to add crestor 20 mg every day, and fenofibrate 160 mg per day

## 2023-12-09 NOTE — Assessment & Plan Note (Signed)
BP Readings from Last 3 Encounters:  12/06/23 128/82  11/27/22 128/72  08/29/17 128/75   Stable, pt to continue medical treatment  - diet, wt control

## 2023-12-09 NOTE — Assessment & Plan Note (Signed)
Stable overall, declines need for SSRI or referral for now

## 2023-12-09 NOTE — Assessment & Plan Note (Signed)
Age and sex appropriate education and counseling updated with regular exercise and diet Referrals for preventative services - for eye exam soon Immunizations addressed - declines covid booster,  Smoking counseling  - none needed Evidence for depression or other mood disorder - none significant Most recent labs reviewed. I have personally reviewed and have noted: 1) the patient's medical and social history 2) The patient's current medications and supplements 3) The patient's height, weight, and BMI have been recorded in the chart

## 2023-12-09 NOTE — Assessment & Plan Note (Signed)
Lab Results  Component Value Date   HGBA1C 9.4 (H) 12/06/2023   Uncontrolled, for start jardianc3 25 every day, start metformin ER 500 mg- 4 every day, refer DM education

## 2023-12-20 ENCOUNTER — Other Ambulatory Visit (HOSPITAL_COMMUNITY): Payer: Self-pay

## 2024-05-30 ENCOUNTER — Ambulatory Visit: Payer: 59 | Admitting: Internal Medicine
